# Patient Record
Sex: Male | Born: 2000 | Race: White | Hispanic: No | Marital: Single | Smoking: Never smoker
Health system: Southern US, Community
[De-identification: ages and names within clinical notes are randomized; demographics above are authoritative.]

## PROBLEM LIST (undated history)

## (undated) DIAGNOSIS — K5190 Ulcerative colitis, unspecified, without complications: Secondary | ICD-10-CM

## (undated) DIAGNOSIS — F32A Depression, unspecified: Secondary | ICD-10-CM

## (undated) HISTORY — DX: Depression, unspecified: F32.A

## (undated) HISTORY — DX: Ulcerative colitis, unspecified, without complications: K51.90

## (undated) HISTORY — PX: NO PAST SURGERIES: SHX2092

---
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
## 2022-01-04 ENCOUNTER — Other Ambulatory Visit (HOSPITAL_BASED_OUTPATIENT_CLINIC_OR_DEPARTMENT_OTHER): Payer: Self-pay

## 2022-01-06 ENCOUNTER — Ambulatory Visit (HOSPITAL_BASED_OUTPATIENT_CLINIC_OR_DEPARTMENT_OTHER): Payer: BC Managed Care – PPO | Attending: Orthopaedic Surgery | Admitting: Physical Therapy

## 2022-01-06 ENCOUNTER — Encounter (HOSPITAL_BASED_OUTPATIENT_CLINIC_OR_DEPARTMENT_OTHER): Payer: Self-pay | Admitting: Physical Therapy

## 2022-01-06 ENCOUNTER — Encounter (HOSPITAL_BASED_OUTPATIENT_CLINIC_OR_DEPARTMENT_OTHER): Payer: Self-pay

## 2022-01-06 DIAGNOSIS — M25511 Pain in right shoulder: Secondary | ICD-10-CM | POA: Diagnosis not present

## 2022-01-06 DIAGNOSIS — G8929 Other chronic pain: Secondary | ICD-10-CM | POA: Diagnosis present

## 2022-01-06 DIAGNOSIS — M25512 Pain in left shoulder: Secondary | ICD-10-CM | POA: Insufficient documentation

## 2022-01-06 NOTE — Therapy (Signed)
?OUTPATIENT PHYSICAL THERAPY SHOULDER Treatment/Discharge  ? ? ?Patient Name: Paul [name] ?MRN: [id number] ?DOB:12/27/2000, 21 y.o., male ?Today's Date: 01/06/2022 ? ? PT End of Session - 01/06/22 1956   ? ? Visit Number 6   ? Number of Visits 12   ? Date for PT Re-Evaluation 01/06/22   ? PT Start Time 0845   ? PT Stop Time 0370   ? PT Time Calculation (min) 42 min   ? Activity Tolerance Patient tolerated treatment well   ? Behavior During Therapy [facility] for tasks assessed/performed   ? ?  ?  ? ?  ? ? ? ? ?History reviewed. No pertinent past medical history. ?History reviewed. No pertinent surgical history. ?There are no problems to display for this patient. ? ? ?PCP: Elnita Maxwell, MD ? ?REFERRING PROVIDER: Donivan Scull MD  ? ?REFERRING DIAG: M25.511,M25.512 (ICD-10-CM) - Acute pain of both shoulders ? ?THERAPY DIAG:  ?Chronic right shoulder pain ? ?Chronic left shoulder pain ? ? ?ONSET DATE: > 1.5 years  ? ?SUBJECTIVE:                                                                                                                                                                                     ? ?SUBJECTIVE STATEMENT: ?The patient made it through his lacrosse season. No dislocations during his game, but he had a dislocation last night playing pick up of his left shoulder .His left shoulder is a little sore.  ? ?PERTINENT HISTORY: ?Nothing  ? ?PAIN:  ?Are you having pain? Yes: NPRS scale: 0/10 only with throwing  ?Pain location: right shoulder  ?Pain description: aching  ?Aggravating factors: use of the shoulder  ?Relieving factors: rest  ? ?PAIN:  ?Are you having pain? No ?VAS scale: 0/10 ?Pain location: right anterior shoulder  ?Pain orientation: Right  ?PAIN TYPE: aching ?Pain description: intermittent  ?Aggravating factors: end range er  ?Relieving factors: not putting his shoulder in that position.   ? ?PRECAUTIONS: None ? ?WEIGHT BEARING RESTRICTIONS No ? ?FALLS:  ?Has patient fallen in last 6 months? No  Number of falls: other then lacrosse  ? ?LIVING ENVIRONMENT: ? ? 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 ?PLOF: Independent ? ?PATIENT GOALS  ? ?To  continue to play through his senior season  ? ?OBJECTIVE:  ?Today's treatment  ?5/10 ?Side lying ER 4 lbs x20 each arm ?Supine ABC 5 lbs   ?  ?Quadruped rock 2x10  ?Alt LE x15 ? ?Row 30 lbs x20  ?Shoulder extension 25 x20  ? ?Cable chop 10lbs 2x15  ?Pallof press 2x15  ? ?High plank reach x10 each  ?High plank alt LE  ? ?Plank  bird dog x10 each  ? ?  ? ? ?4/25 ?Bench press 3lb bar for training- 45 lb press upstairs ?Standing & seated lat pull down ?Standing row ?Trx planks ?Standing ER at 75 abd- parallel to ER & top of thighs to ER ?3lb bar with black tband resist- recreating defensive move on field ?Ktape applied for cues to rectus & obliques ? ? ?4/21 ?Side lying ER 4 lbs x20 each arm ?Supine ABC 5 lbs   ?  ?Quadruped rock 2x10  ?Alt LE x15 ? ?Row 30 lbs x20  ?Shoulder extension 25 x20  ? ?Cable chop 10lbs 2x15  ?Pallof press 2x15  ? ?4/4 ?Side lying ER 4 lbs x20 each arm ?Supine ABC 5 lbs   ? ?Standing IR and ER red band x20 each arm  ? ?UBE 2 in fwd and back  ? ?High plank hold 30 sec  ?Hight plank alt UE x10 each  ?High plank alt LE x10  ? ?Row 30 lbs 2x15  ? ?Shoulder extension 2x15 30 lb  ? ? ? ? ?PATIENT EDUCATION: ?Education details: updated HEP  ?Person educated: Patient ?Education method: Explanation, Demonstration, Tactile cues, Verbal cues, and Handouts ?Education comprehension: verbalized understanding, returned demonstration, verbal cues required, tactile cues required, and needs further education ? ? ?HOME EXERCISE PROGRAM: ?Access Code: [email or web address] ?URL: [email or web address] ? ? ? ?ASSESSMENT: ? ?CLINICAL IMPRESSION: ?Patient has reached max potential for physical therapy at this time he has a full HEP to work. On. He continues to have dislocations at this time. He is done with his lacrosse season. He hopes to continue with weight lifting  We reviewed weight lifting activity to avoid such as exercises where he cant see the back of his hands. He will talk with his mom and the MD and figure out what the best course of action is. In the meantime he will continue with his exercises. He was given a high plank series today..  ? ?OBJECTIVE IMPAIRMENTS  ?decreased activity tolerance, decreased ROM, decreased strength, and pain ?ACTIVITY LIMITATIONS community activity and Lacrosse  .  ? ?No PERSONAL FACTORS  are also affecting patient's functional outcome.  ? ? ?REHAB POTENTIAL: Excellent ? ?CLINICAL DECISION MAKING: Stable/uncomplicated ? ?EVALUATION COMPLEXITY: Low ? ? ?GOALS: ?Goals reviewed with patient? No ? ?SHORT TERM GOALS: Target date: 01/27/2022 ? ?Patient will demonstrate full end range motion bilateral with all movements without pan  ?Baseline:  ?Goal status:no pain archived  ? ?2.  Patient will be independent with a mid range strengthening program  ?Baseline:  ?Goal status: independent  ? ?3.  Patient will report no dislocations of his shoulder over a 3 week period ?Baseline:  ?Goal status: played the whole season with 1 dislocation  ? ? ?LONG TERM GOALS: Target date: 02/17/2022 ? ?Patient will avoid surgical intervention and finish his lacrosse season   ?Baseline:  ?Goal status: achieved  ? ?2.  Patient will have a full strength and stabilization program.  ?Baseline:  ?Goal status: has full program  ? ?PLAN: ?PT FREQUENCY: 1-2x/week ? ?PT DURATION: 6 weeks ? ?PLANNED INTERVENTIONS: Therapeutic exercises, Therapeutic activity, Neuromuscular re-education, Balance training, Gait training, Patient/Family education, Joint mobilization, Electrical stimulation, Cryotherapy, Moist heat, Taping, and Manual therapy ? ?PLAN FOR NEXT SESSION: continue with mid range stabilization activity, consider high plank activity, progress ER/IR activity and amount of abduction; continue with closed chain exercises; continue to review closed chained exercises.  ?Ktape  helpful cue? ? ?PHYSICAL THERAPY DISCHARGE SUMMARY ? ?Visits from Start of Care:  6 ? ?Current functional level related to goals / functional outcomes: ?No pain  ?  ?Remaining deficits: ?Still dislocating  ?  ?Education / Equipment: ?Reviewed HEP and symptom management  ? ?Patient agrees to discharge. Patient goals were met. Patient is being discharged due to being pleased with the current functional level.  ? ?Carney Living, PT DPT  ?01/06/2022, 7:59 PM  ?

## 2022-01-07 ENCOUNTER — Encounter (HOSPITAL_BASED_OUTPATIENT_CLINIC_OR_DEPARTMENT_OTHER): Payer: Self-pay | Admitting: Physical Therapy

## 2022-01-07 ENCOUNTER — Other Ambulatory Visit (HOSPITAL_BASED_OUTPATIENT_CLINIC_OR_DEPARTMENT_OTHER): Payer: Self-pay

## 2022-01-07 MED ORDER — AMPHETAMINE-DEXTROAMPHET ER 15 MG PO CP24
ORAL_CAPSULE | ORAL | 0 refills | Status: DC
  Filled 2022-01-07: qty 30, 30d supply, fill #0

## 2022-02-02 ENCOUNTER — Other Ambulatory Visit (HOSPITAL_BASED_OUTPATIENT_CLINIC_OR_DEPARTMENT_OTHER): Payer: Self-pay

## 2022-02-12 ENCOUNTER — Other Ambulatory Visit (HOSPITAL_BASED_OUTPATIENT_CLINIC_OR_DEPARTMENT_OTHER): Payer: Self-pay

## 2022-02-12 MED ORDER — AMPHETAMINE-DEXTROAMPHET ER 15 MG PO CP24
ORAL_CAPSULE | ORAL | 0 refills | Status: DC
  Filled 2022-02-12: qty 30, 30d supply, fill #0

## 2022-03-09 ENCOUNTER — Other Ambulatory Visit (HOSPITAL_BASED_OUTPATIENT_CLINIC_OR_DEPARTMENT_OTHER): Payer: Self-pay

## 2022-03-09 MED ORDER — AMPHETAMINE-DEXTROAMPHET ER 15 MG PO CP24
ORAL_CAPSULE | ORAL | 0 refills | Status: DC
  Filled 2022-03-12: qty 30, 30d supply, fill #0

## 2022-03-10 ENCOUNTER — Other Ambulatory Visit (HOSPITAL_BASED_OUTPATIENT_CLINIC_OR_DEPARTMENT_OTHER): Payer: Self-pay

## 2022-03-10 MED ORDER — AMPICILLIN 500 MG PO CAPS
500.0000 mg | ORAL_CAPSULE | Freq: Two times a day (BID) | ORAL | 2 refills | Status: DC
  Filled 2022-03-10: qty 60, 30d supply, fill #0
  Filled 2022-04-05: qty 60, 30d supply, fill #1
  Filled 2022-05-27: qty 60, 30d supply, fill #2

## 2022-03-11 ENCOUNTER — Other Ambulatory Visit (HOSPITAL_BASED_OUTPATIENT_CLINIC_OR_DEPARTMENT_OTHER): Payer: Self-pay

## 2022-03-12 ENCOUNTER — Other Ambulatory Visit (HOSPITAL_BASED_OUTPATIENT_CLINIC_OR_DEPARTMENT_OTHER): Payer: Self-pay

## 2022-04-05 ENCOUNTER — Other Ambulatory Visit (HOSPITAL_BASED_OUTPATIENT_CLINIC_OR_DEPARTMENT_OTHER): Payer: Self-pay

## 2022-04-05 DIAGNOSIS — L245 Irritant contact dermatitis due to other chemical products: Secondary | ICD-10-CM | POA: Diagnosis not present

## 2022-04-05 MED ORDER — HYDROCORTISONE 2.5 % EX CREA
TOPICAL_CREAM | CUTANEOUS | 2 refills | Status: DC
  Filled 2022-04-05: qty 30, 7d supply, fill #0

## 2022-04-07 DIAGNOSIS — Z0000 Encounter for general adult medical examination without abnormal findings: Secondary | ICD-10-CM | POA: Diagnosis not present

## 2022-04-07 DIAGNOSIS — F900 Attention-deficit hyperactivity disorder, predominantly inattentive type: Secondary | ICD-10-CM | POA: Diagnosis not present

## 2022-04-12 ENCOUNTER — Other Ambulatory Visit (HOSPITAL_BASED_OUTPATIENT_CLINIC_OR_DEPARTMENT_OTHER): Payer: Self-pay

## 2022-04-12 MED ORDER — AMPHETAMINE-DEXTROAMPHET ER 15 MG PO CP24
ORAL_CAPSULE | ORAL | 0 refills | Status: DC
  Filled 2022-04-12: qty 30, 30d supply, fill #0

## 2022-04-20 ENCOUNTER — Encounter: Payer: Self-pay | Admitting: Nurse Practitioner

## 2022-04-20 ENCOUNTER — Telehealth: Payer: Self-pay

## 2022-04-20 DIAGNOSIS — K921 Melena: Secondary | ICD-10-CM | POA: Diagnosis not present

## 2022-04-20 DIAGNOSIS — R195 Other fecal abnormalities: Secondary | ICD-10-CM

## 2022-04-20 NOTE — Telephone Encounter (Addendum)
-----   Message from Benancio Deeds, MD sent at 04/20/2022 12:45 PM EDT ----- Regarding: RE: urgent add-on Thursday clinic Yes that is okay with me. Jill Side if you see this patient you can staff with me.  Thanks ----- Message ----- From: Missy Sabins, RN Sent: 04/20/2022   9:39 AM EDT To: Benancio Deeds, MD Subject: RE: urgent add-on Thursday clinic              Dr. Adela Lank, I can get him scheduled with [facility] on Thursday, 04/22/22 at 1:30 pm. That will work better for the staff since you will be overbooked for the morning, if you are OK with this. Let me know. Thanks ----- Message ----- From: Benancio Deeds, MD Sent: 04/20/2022   9:31 AM EDT To: Missy Sabins, RN Subject: urgent add-on Thursday clinic                  [facility],  This patient needs to be added to my clinic on Thursday at 1130 AM if you can help get a spot for him? Can you call mother Toniann Fail - [phone] to confirm they can make it that day? Can you also order a GI pathogen panel to the lab and ask her to have her son come by the lab to get the container and submit when he can? I would also like to do a CBC, CRP, and CMET to make sure okay if you can order that. Thanks very much. Can you confirm with me she is okay to schedule for this time?   Thanks Dr. Mervyn Skeeters

## 2022-04-20 NOTE — Telephone Encounter (Signed)
Called and spoke with patient's mother, Toniann Fail. She is aware that we have rescheduled patient's appt to Thursday, 04/22/22 at 1:30 pm with Alcide Evener, NP. Toniann Fail is aware that Dr. Adela Lank would like for patient to complete stool study and lab work. Toniann Fail states that she is going to pick patient up from college tomorrow after work. They will stop by at their convenience to complete labs and stool study. Toniann Fail verbalized understanding and had no concerns at the end of the call.

## 2022-04-22 ENCOUNTER — Other Ambulatory Visit (INDEPENDENT_AMBULATORY_CARE_PROVIDER_SITE_OTHER): Payer: BC Managed Care – PPO

## 2022-04-22 ENCOUNTER — Encounter: Payer: Self-pay | Admitting: Nurse Practitioner

## 2022-04-22 ENCOUNTER — Ambulatory Visit: Payer: BC Managed Care – PPO | Admitting: Nurse Practitioner

## 2022-04-22 ENCOUNTER — Other Ambulatory Visit (HOSPITAL_BASED_OUTPATIENT_CLINIC_OR_DEPARTMENT_OTHER): Payer: Self-pay

## 2022-04-22 VITALS — BP 100/60 | HR 68 | Ht 71.5 in | Wt 187.0 lb

## 2022-04-22 DIAGNOSIS — R195 Other fecal abnormalities: Secondary | ICD-10-CM

## 2022-04-22 DIAGNOSIS — R152 Fecal urgency: Secondary | ICD-10-CM

## 2022-04-22 DIAGNOSIS — R301 Vesical tenesmus: Secondary | ICD-10-CM

## 2022-04-22 DIAGNOSIS — K921 Melena: Secondary | ICD-10-CM | POA: Diagnosis not present

## 2022-04-22 DIAGNOSIS — R197 Diarrhea, unspecified: Secondary | ICD-10-CM | POA: Diagnosis not present

## 2022-04-22 DIAGNOSIS — K5900 Constipation, unspecified: Secondary | ICD-10-CM | POA: Diagnosis not present

## 2022-04-22 DIAGNOSIS — K5180 Other ulcerative colitis without complications: Secondary | ICD-10-CM | POA: Diagnosis not present

## 2022-04-22 DIAGNOSIS — K625 Hemorrhage of anus and rectum: Secondary | ICD-10-CM | POA: Diagnosis not present

## 2022-04-22 DIAGNOSIS — R140 Abdominal distension (gaseous): Secondary | ICD-10-CM

## 2022-04-22 DIAGNOSIS — R17 Unspecified jaundice: Secondary | ICD-10-CM | POA: Insufficient documentation

## 2022-04-22 LAB — CBC WITH DIFFERENTIAL/PLATELET
Basophils Absolute: 0 10*3/uL (ref 0.0–0.1)
Basophils Relative: 0.3 % (ref 0.0–3.0)
Eosinophils Absolute: 0.5 10*3/uL (ref 0.0–0.7)
Eosinophils Relative: 5.6 % — ABNORMAL HIGH (ref 0.0–5.0)
HCT: 43.7 % (ref 36.0–49.0)
Hemoglobin: 15.1 g/dL (ref 12.0–16.0)
Lymphocytes Relative: 28 % (ref 24.0–48.0)
Lymphs Abs: 2.4 10*3/uL (ref 0.7–4.0)
MCHC: 34.5 g/dL (ref 31.0–37.0)
MCV: 84.3 fl (ref 78.0–98.0)
Monocytes Absolute: 0.8 10*3/uL (ref 0.1–1.0)
Monocytes Relative: 9.2 % (ref 3.0–12.0)
Neutro Abs: 4.9 10*3/uL (ref 1.4–7.7)
Neutrophils Relative %: 56.9 % (ref 43.0–71.0)
Platelets: 239 10*3/uL (ref 150.0–575.0)
RBC: 5.18 Mil/uL (ref 3.80–5.70)
RDW: 12.7 % (ref 11.4–15.5)
WBC: 8.7 10*3/uL (ref 4.5–13.5)

## 2022-04-22 LAB — BILIRUBIN, DIRECT: Bilirubin, Direct: 0.3 mg/dL (ref 0.0–0.3)

## 2022-04-22 LAB — COMPREHENSIVE METABOLIC PANEL
ALT: 16 U/L (ref 0–53)
AST: 17 U/L (ref 0–37)
Albumin: 4.7 g/dL (ref 3.5–5.2)
Alkaline Phosphatase: 90 U/L (ref 52–171)
BUN: 13 mg/dL (ref 6–23)
CO2: 27 mEq/L (ref 19–32)
Calcium: 9.9 mg/dL (ref 8.4–10.5)
Chloride: 106 mEq/L (ref 96–112)
Creatinine, Ser: 1.17 mg/dL (ref 0.40–1.50)
GFR: 90.63 mL/min (ref >60.00)
Glucose, Bld: 86 mg/dL (ref 70–99)
Potassium: 3.8 mEq/L (ref 3.5–5.1)
Sodium: 140 mEq/L (ref 135–145)
Total Bilirubin: 1.6 mg/dL — ABNORMAL HIGH (ref 0.2–1.2)
Total Protein: 7.2 g/dL (ref 6.0–8.3)

## 2022-04-22 LAB — C-REACTIVE PROTEIN: CRP: <1 mg/dL (ref 0.5–20.0)

## 2022-04-22 MED ORDER — NA SULFATE-K SULFATE-MG SULF 17.5-3.13-1.6 GM/177ML PO SOLN
1.0000 | Freq: Once | ORAL | 0 refills | Status: AC
  Filled 2022-04-22: qty 354, 1d supply, fill #0

## 2022-04-22 NOTE — Progress Notes (Signed)
04/22/2022 Clydene Pugh [id number] Dec 06, 2000   CHIEF COMPLAINT: Bloody stools   HISTORY OF PRESENT ILLNESS:  Paul [name] is a 21 year old male with a past medical history of depression. No past surgical history. He presents today as requested by Dr. Benay Spice for further evaluation regarding bloody bowel movements. He is accompanied by his mother who is a Therapist, sports, she works at Dr. [employer] office. Paul [name] endorsed having constipation 3 weeks ago, stools were solid and hard but easy to pass. He initially passed a small to moderate amount of darker red blood with brighter red blood in the mucous per the rectum. No associated anorectal pain at that time. However, he recalled working out in the gym, doing squats, which resulted in hemorrhoidal swelling, pain and minor bleeding. It hurt to sit down. He used Preparation H and his symptoms improved. Towards the end of July into early August 2023, he became constipated and bloated. He took Miralax for 2 days and his constipation somewhat improved. His bowel pattern has varied with increased urgency and tenesmus symptoms. He had diarrhea with urgency x 1 day three days ago. Today, he passed a pencil thin stool with bloody mucous and gas. No abdominal pain. No current anorectal pain.  He takes Ampicillin 500 mg twice daily for acne for several months.  He takes Advil 215m three tabs once every 3 weeks for headaches. He is a fMuseum/gallery exhibitions officerat [employer] he went to student health on 04/20/2022 and labs were done which showed a WBC count of 6.8.  Hemoglobin 15.8.  Hematocrit 47.3.  Platelet 225.  Iron 81.  Iron saturation 25%.  TIBC 338.  Total bili 1.4.  Alk phos 112.  AST 21.  ALT 18.  BUN 13.  Creatinine 1.12.  Labs today showed a WBC count of 8.7.  Hemoglobin 15.1.  Hematocrit 43.7.  Platelet 239.  Total bili 1.6 with normal alk phos, AST/ALT levels.  CRP < 1.0.  A GI pathogen panel was collected, results pending.    Maternal great  grandmother with history of colon cancer.  Mother and maternal grandfather with history of colon polyps.  Maternal cousin with history of ulcerative colitis.      Latest Ref Rng & Units 04/22/2022    7:42 AM  CBC  WBC 4.5 - 13.5 K/uL 8.7   Hemoglobin 12.0 - 16.0 g/dL 15.1   Hematocrit 36.0 - 49.0 % 43.7   Platelets 150.0 - 575.0 K/uL 239.0        Latest Ref Rng & Units 04/22/2022    7:42 AM  CMP  Glucose 70 - 99 mg/dL 86   BUN 6 - 23 mg/dL 13   Creatinine 0.40 - 1.50 mg/dL 1.17   Sodium 135 - 145 mEq/L 140   Potassium 3.5 - 5.1 mEq/L 3.8   Chloride 96 - 112 mEq/L 106   CO2 19 - 32 mEq/L 27   Calcium 8.4 - 10.5 mg/dL 9.9   Total Protein 6.0 - 8.3 g/dL 7.2   Total Bilirubin 0.2 - 1.2 mg/dL 1.6   Alkaline Phos 52 - 171 U/L 90   AST 0 - 37 U/L 17   ALT 0 - 53 U/L 16     CRP < 1   Past Medical History:  Diagnosis Date   Depression    Past Surgical History:  Procedure Laterality Date   NO PAST SURGERIES     Social History: He is a fEnvironmental managerat [employer]  Non-smoker.  No alcohol use.  No drug use.  Family History: family history includes Bladder Cancer in his paternal grandfather; Colon cancer in his maternal great-grandmother; Colon polyps in his maternal grandfather and mother; Diabetes in his maternal grandmother, maternal great-grandmother, and maternal uncle; Heart disease in his paternal grandmother; Hyperlipidemia in his father and paternal grandmother; Hypertension in his father and maternal grandmother; Irritable bowel syndrome in his mother; Prostate cancer in his paternal grandfather; Ulcerative colitis in his cousin. [place]   No Known Allergies    Outpatient Encounter Medications as of 04/22/2022  Medication Sig   amphetamine-dextroamphetamine (ADDERALL XR) 15 MG 24 hr capsule Take 1 capsules by mouth every morning   ampicillin (PRINCIPEN) 500 MG capsule Take 1 capsule by mouth twice a day   ascorbic acid (VITAMIN C) 500  MG tablet Take 1 tablet by mouth daily.   clindamycin (CLINDAGEL) 1 % gel Apply 1 Application topically daily.   fluticasone (FLONASE ALLERGY RELIEF) 50 MCG/ACT nasal spray Place 1 Squirt into the nose daily.   hydrocortisone 2.5 % cream Apply 1 gram to skin twice a day to affected area twice a day for 7-10 days as needed   loratadine (CLARITIN) 10 MG tablet Take 1 tablet by mouth as needed.   tretinoin (RETIN-A) 0.025 % cream Apply a pea-sized amount to face nightly   [DISCONTINUED] ADDERALL XR 15 MG 24 hr capsule TAKE 1 CAPSULE BY MOUTH EVERY MORNING   [DISCONTINUED] amphetamine-dextroamphetamine (ADDERALL XR) 15 MG 24 hr capsule TAKE 1 CAPSULE BY MOUTH EVERY MORNING   [DISCONTINUED] amphetamine-dextroamphetamine (ADDERALL XR) 15 MG 24 hr capsule TAKE 1 CAPSULE BY MOUTH EVERY MORNING   [DISCONTINUED] amphetamine-dextroamphetamine (ADDERALL XR) 15 MG 24 hr capsule TAKE 1 CAPSULE BY MOUTH EVERY MORNING   [DISCONTINUED] amphetamine-dextroamphetamine (ADDERALL XR) 15 MG 24 hr capsule TAKE 1 CAPSULE BY MOUTH EVERY MORNING   [DISCONTINUED] amphetamine-dextroamphetamine (ADDERALL XR) 15 MG 24 hr capsule TAKE 1 CAPSULE BY MOUTH EVERY MORNING   [DISCONTINUED] amphetamine-dextroamphetamine (ADDERALL XR) 15 MG 24 hr capsule TAKE 1 CAPSULE BY MOUTH EVERY MORNING   [DISCONTINUED] amphetamine-dextroamphetamine (ADDERALL XR) 15 MG 24 hr capsule Take 1 capsule by mouth every morning   [DISCONTINUED] amphetamine-dextroamphetamine (ADDERALL XR) 15 MG 24 hr capsule TAKE 1 CAPSULE BY MOUTH EVERY MORNING   [DISCONTINUED] amphetamine-dextroamphetamine (ADDERALL XR) 15 MG 24 hr capsule Take 1 capsule by mouth in the morning   [DISCONTINUED] amphetamine-dextroamphetamine (ADDERALL XR) 15 MG 24 hr capsule Take 1 capsule by mouth in the morning   [DISCONTINUED] amphetamine-dextroamphetamine (ADDERALL XR) 15 MG 24 hr capsule Take 1 capsule by mouth in the morning   [DISCONTINUED] amphetamine-dextroamphetamine (ADDERALL XR)  15 MG 24 hr capsule 1 cap(s) Oral qam,x30 day(s)   [DISCONTINUED] doxycycline (VIBRAMYCIN) 100 MG capsule Take 1 capsule by mouth twice a day with food.  Avoid dairy products 2 hours before & after dose.   [DISCONTINUED] influenza vac split quadrivalent PF (FLUARIX) 0.5 ML injection Inject into the muscle.   [DISCONTINUED] oseltamivir (TAMIFLU) 75 MG capsule 1 cap(s) Oral bid,x5 day(s)   No facility-administered encounter medications on file as of 04/22/2022.    REVIEW OF SYSTEMS:  Gen: Denies fever, sweats or chills. No weight loss.  CV: Denies chest pain, palpitations or edema. Resp: Denies cough, shortness of breath of hemoptysis.  GI: See HPI.  No GERD symptoms.   GU : Denies urinary burning, blood in urine, increased urinary frequency or incontinence. MS: Denies joint pain, muscles aches or weakness. Derm: Denies rash, itchiness,  skin lesions or unhealing ulcers. Psych: Denies depression, anxiety or memory loss. Heme: Denies bruising, easy bleeding. Neuro:  Denies headaches, dizziness or paresthesias. Endo:  Denies any problems with DM, thyroid or adrenal function.  PHYSICAL EXAM: BP 100/60 (BP Location: Left Arm, Patient Position: Sitting, Cuff Size: Normal)   Pulse 68   Ht 5' 11.5" (1.816 m) Comment: height measured without shoes  Wt 187 lb (84.8 kg)   BMI 25.72 kg/m  General: 21 year old male in no acute distress. Head: Normocephalic and atraumatic. Eyes:  Sclerae non-icteric, conjunctive pink. Ears: Normal auditory acuity. Mouth: Dentition intact. No ulcers or lesions.  Neck: Supple, no lymphadenopathy or thyromegaly.  Lungs: Clear bilaterally to auscultation without wheezes, crackles or rhonchi. Heart: Regular rate and rhythm. No murmur, rub or gallop appreciated.  Abdomen: Soft, nontender, non distended. No masses. No hepatosplenomegaly. Normoactive bowel sounds x 4 quadrants.  Rectal: No external hemorrhoids.  Mildly friable coasted to the posterior anal area without a  discrete fissure.  Internal hemorrhoids palpated without prolapse.  No blood, mucus or purulent discharge.  No stool or mass in the rectal vault.  Patty RN present during exam. Musculoskeletal: Symmetrical with no gross deformities. Skin: Warm and dry. No rash or lesions on visible extremities. Extremities: No edema. Neurological: Alert oriented x 4, no focal deficits.  Psychological:  Alert and cooperative. Normal mood and affect.  ASSESSMENT AND PLAN:  39) 21 year old male with rectal bleeding, bloody mucous per the rectum with tenesmus and urgent bowel movements (mostly constipation, 1 episode of diarrhea) concerning for possible colitis/proctitis.  Normal CBC and CRP.  -GI pathogen panel pending -Diagnostic colonoscopy benefits and risks discussed including risk with sedation, risk of bleeding, perforation and infection  -Apply a small amount of Desitin inside the anal opening and to the external anal area tid as needed for anal or hemorrhoidal irritation/bleeding.  Patient instructed not to use Desitin the night before or morning of his colonoscopy date. 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 -Add direct bilirubin level to today's earlier lab draw        CC:  Elnita Maxwell, MD

## 2022-04-22 NOTE — Patient Instructions (Addendum)
1) Push fluids   2) Take Miralax 1 capful mixed in 8 ounces of water at bed time for constipation as tolerated  3) Apply a small amount of Desitin inside the anal opening and to the external anal area tid as needed for anal or hemorrhoidal irritation/bleeding. DO NOT USE DESITIN NIGHT OR MORNING BEFORE YOUR COLONOSCOPY   4) We have scheduled you for a colonoscopy and provided you the instructions.

## 2022-04-22 NOTE — Progress Notes (Signed)
Agree with assessment and plan as outlined.  

## 2022-04-23 ENCOUNTER — Encounter: Payer: Self-pay | Admitting: Gastroenterology

## 2022-04-25 LAB — GI PROFILE, STOOL, PCR

## 2022-04-26 ENCOUNTER — Other Ambulatory Visit (HOSPITAL_BASED_OUTPATIENT_CLINIC_OR_DEPARTMENT_OTHER): Payer: Self-pay

## 2022-04-26 ENCOUNTER — Encounter: Payer: Self-pay | Admitting: Gastroenterology

## 2022-04-26 ENCOUNTER — Ambulatory Visit (AMBULATORY_SURGERY_CENTER): Payer: BC Managed Care – PPO | Admitting: Gastroenterology

## 2022-04-26 VITALS — BP 91/69 | HR 63 | Temp 98.6°F | Resp 13 | Ht 71.0 in | Wt 187.0 lb

## 2022-04-26 DIAGNOSIS — K6289 Other specified diseases of anus and rectum: Secondary | ICD-10-CM | POA: Diagnosis not present

## 2022-04-26 DIAGNOSIS — K921 Melena: Secondary | ICD-10-CM | POA: Diagnosis not present

## 2022-04-26 DIAGNOSIS — K51011 Ulcerative (chronic) pancolitis with rectal bleeding: Secondary | ICD-10-CM

## 2022-04-26 DIAGNOSIS — K51211 Ulcerative (chronic) proctitis with rectal bleeding: Secondary | ICD-10-CM | POA: Diagnosis not present

## 2022-04-26 MED ORDER — MESALAMINE 1.2 G PO TBEC
DELAYED_RELEASE_TABLET | ORAL | 3 refills | Status: DC
  Filled 2022-04-26: qty 120, 60d supply, fill #0
  Filled 2022-05-27 – 2022-06-10 (×2): qty 120, 60d supply, fill #1

## 2022-04-26 MED ORDER — MESALAMINE 1000 MG RE SUPP
1000.0000 mg | Freq: Every day | RECTAL | 1 refills | Status: DC
  Filled 2022-04-26: qty 30, 30d supply, fill #0

## 2022-04-26 MED ORDER — SODIUM CHLORIDE 0.9 % IV SOLN: 500.0000 mL | Freq: Once | INTRAVENOUS | Status: DC

## 2022-04-26 NOTE — Patient Instructions (Signed)
Discharge instructions given. Prescriptions sent to pharmacy. Biopsies taken. Resume previous medications. YOU HAD AN ENDOSCOPIC PROCEDURE TODAY AT THE [facility]:   Refer to the procedure report that was given to you for any specific questions about what was found during the examination.  If the procedure report does not answer your questions, please call your gastroenterologist to clarify.  If you requested that your care partner not be given the details of your procedure findings, then the procedure report has been included in a sealed envelope for you to review at your convenience later.  YOU SHOULD EXPECT: Some feelings of bloating in the abdomen. Passage of more gas than usual.  Walking can help get rid of the air that was put into your GI tract during the procedure and reduce the bloating. If you had a lower endoscopy (such as a colonoscopy or flexible sigmoidoscopy) you may notice spotting of blood in your stool or on the toilet paper. If you underwent a bowel prep for your procedure, you may not have a normal bowel movement for a few days.  Please Note:  You might notice some irritation and congestion in your nose or some drainage.  This is from the oxygen used during your procedure.  There is no need for concern and it should clear up in a day or so.  SYMPTOMS TO REPORT IMMEDIATELY:  Following lower endoscopy (colonoscopy or flexible sigmoidoscopy):  Excessive amounts of blood in the stool  Significant tenderness or worsening of abdominal pains  Swelling of the abdomen that is new, acute  Fever of 100F or higher   For urgent or emergent issues, a gastroenterologist can be reached at any hour by calling [phone]. Do not use MyChart messaging for urgent concerns.    DIET:  We do recommend a small meal at first, but then you may proceed to your regular diet.  Drink plenty of fluids but you should avoid alcoholic beverages for 24 hours.  ACTIVITY:  You should plan  to take it easy for the rest of today and you should NOT DRIVE or use heavy machinery until tomorrow (because of the sedation medicines used during the test).    FOLLOW UP: Our staff will call the number listed on your records the next business day following your procedure.  We will call around 7:15- 8:00 am to check on you and address any questions or concerns that you may have regarding the information given to you following your procedure. If we do not reach you, we will leave a message.  If you develop any symptoms (ie: fever, flu-like symptoms, shortness of breath, cough etc.) before then, please call [phone].  If you test positive for Covid 19 in the 2 weeks post procedure, please call and report this information to [place].    If any biopsies were taken you will be contacted by phone or by letter within the next 1-3 weeks.  Please call us at [phone] if you have not heard about the biopsies in 3 weeks.    SIGNATURES/CONFIDENTIALITY: You and/or your care partner have signed paperwork which will be entered into your electronic medical record.  These signatures attest to the fact that that the information above on your After Visit Summary has been reviewed and is understood.  Full responsibility of the confidentiality of this discharge information lies with you and/or your care-partner.

## 2022-04-26 NOTE — Progress Notes (Signed)
Report to pacu rn. Vss. Care resumed by rn. 

## 2022-04-26 NOTE — Progress Notes (Signed)
Called to room to assist during endoscopic procedure.  Patient ID and intended procedure confirmed with present staff. Received instructions for my participation in the procedure from the performing physician.  

## 2022-04-26 NOTE — Progress Notes (Signed)
History and Physical Interval Note: Patient seen on 04/22/22 - no interval changes. Intermittent rectal bleeding with mucous and urgency. Labs look okay and GI pathogen panel negative. Colonoscopy done to evaluate for colitis. Have discussed risks / benefits and he agrees, wishes to proceed.  04/26/2022 2:45 PM  Paul [name]  has presented today for endoscopic procedure(s), with the diagnosis of  Encounter Diagnosis  Name Primary?   Blood in stool Yes  .  The various methods of evaluation and treatment have been discussed with the patient and/or family. After consideration of risks, benefits and other options for treatment, the patient has consented to  the endoscopic procedure(s).   The patient's history has been reviewed, patient examined, no change in status, stable for surgery.  I have reviewed the patient's chart and labs.  Questions were answered to the patient's satisfaction.    Harlin Rain, MD [facility]

## 2022-04-26 NOTE — Op Note (Addendum)
Paul [name] Patient Name: Paul [name] Procedure Date: 04/26/2022 2:39 PM MRN: [id number] Endoscopist: Viviann Spare P. Adela Lank , MD Age: 21 Referring MD:  Date of Birth: Aug 03, 2001 Gender: Male Account #: [id number] Procedure:                Colonoscopy Indications:              Rectal bleeding, Change in bowel habits (urgency,                            mucous). GI pathogen panel negative Medicines:                Monitored Anesthesia Care Procedure:                Pre-Anesthesia Assessment:                           - Prior to the procedure, a History and Physical                            was performed, and patient medications and                            allergies were reviewed. The patient's tolerance of                            previous anesthesia was also reviewed. The risks                            and benefits of the procedure and the sedation                            options and risks were discussed with the patient.                            All questions were answered, and informed consent                            was obtained. Prior Anticoagulants: The patient has                            taken no previous anticoagulant or antiplatelet                            agents. ASA Grade Assessment: II - A patient with                            mild systemic disease. After reviewing the risks                            and benefits, the patient was deemed in                            satisfactory condition to undergo the procedure.  After obtaining informed consent, the colonoscope                            was passed under direct vision. Throughout the                            procedure, the patient's blood pressure, pulse, and                            oxygen saturations were monitored continuously. The                            CF [id number] #[id number] was introduced through the anus                            and advanced to  the the terminal ileum, with                            identification of the appendiceal orifice and IC                            valve. The colonoscopy was performed without                            difficulty. The patient tolerated the procedure                            well. The quality of the bowel preparation was                            good. The terminal ileum, ileocecal valve,                            appendiceal orifice, and rectum were photographed. Scope In: 2:51:51 PM Scope Out: 3:08:59 PM Scope Withdrawal Time: 0 hours 13 minutes 51 seconds  Total Procedure Duration: 0 hours 17 minutes 8 seconds  Findings:                 The perianal and digital rectal examinations were                            normal.                           The terminal ileum appeared normal.                           Diffuse mild to moderate inflammation characterized                            by altered vascularity, erosions, erythema,                            friability and granularity was found in the rectum  up through the distal recto-sigmoid junction                            (roughly anal verge through distal 15cm of colon).                            Biopsies were taken with a cold forceps for                            histology.                           The exam was otherwise without abnormality.                            Retroflexed views of the rectum not obtained due to                            inflammation. Complications:            No immediate complications. Estimated blood loss:                            Minimal. Estimated Blood Loss:     Estimated blood loss was minimal. Impression:               - The examined portion of the ileum was normal.                           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 Biopsied.                           - The examination was otherwise normal.                           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 Will discuss with the patient                            and family Recommendation:           - Patient has a contact number available for                            emergencies. The signs and symptoms of potential                            delayed complications were discussed with the  patient. Return to normal activities tomorrow.                            Written discharge instructions were provided to the                            patient.                           - Resume previous diet.                           - Continue present medications.                           - Await pathology results.                           - Start Canasa suppository - one every night                           - Start Lialda - 2 tabs daily                           - Avoid all NSAIDs [name], MD 04/26/2022 3:18:13 PM This report has been signed electronically.

## 2022-04-27 ENCOUNTER — Other Ambulatory Visit (HOSPITAL_BASED_OUTPATIENT_CLINIC_OR_DEPARTMENT_OTHER): Payer: Self-pay

## 2022-04-27 ENCOUNTER — Telehealth: Payer: Self-pay | Admitting: *Deleted

## 2022-04-27 NOTE — Telephone Encounter (Signed)
Attempt for follow up phone call. No answer at number given.  Unable to leave a voicemail.

## 2022-04-28 ENCOUNTER — Telehealth: Payer: Self-pay | Admitting: Gastroenterology

## 2022-04-28 ENCOUNTER — Other Ambulatory Visit (HOSPITAL_BASED_OUTPATIENT_CLINIC_OR_DEPARTMENT_OTHER): Payer: Self-pay

## 2022-04-28 ENCOUNTER — Other Ambulatory Visit: Payer: Self-pay

## 2022-04-28 DIAGNOSIS — K921 Melena: Secondary | ICD-10-CM

## 2022-04-28 MED ORDER — MESALAMINE 1000 MG RE SUPP: 1000.0000 mg | Freq: Every day | RECTAL | 1 refills | Status: DC

## 2022-04-28 NOTE — Telephone Encounter (Signed)
Mesalamine 1000 mg Suppository was sent to patient's pharmacy [facility] on [address]

## 2022-04-28 NOTE — Telephone Encounter (Signed)
Patient mother called, states patient needs mesalamine 1000 mg suppository to be call in for [facility] to [name] [place] on [address] phone # [phone]. Please call to advise.

## 2022-05-19 ENCOUNTER — Encounter: Payer: Self-pay | Admitting: Gastroenterology

## 2022-05-19 DIAGNOSIS — K51011 Ulcerative (chronic) pancolitis with rectal bleeding: Secondary | ICD-10-CM

## 2022-05-19 NOTE — Telephone Encounter (Signed)
BMET and fecal calprotectin orders printed and faxed to [facility] at [phone].

## 2022-05-19 NOTE — Addendum Note (Signed)
Addended by: Yevette Edwards on: 05/19/2022 04:45 PM   Modules accepted: Orders

## 2022-05-20 ENCOUNTER — Ambulatory Visit: Payer: BC Managed Care – PPO | Admitting: Nurse Practitioner

## 2022-05-21 DIAGNOSIS — K51011 Ulcerative (chronic) pancolitis with rectal bleeding: Secondary | ICD-10-CM | POA: Diagnosis not present

## 2022-05-25 ENCOUNTER — Telehealth: Payer: Self-pay | Admitting: Gastroenterology

## 2022-05-25 NOTE — Telephone Encounter (Signed)
Patient or his mother reviewed MyChart message. Last read by Clydene Pugh at 10:12 AM on 05/25/2022.

## 2022-05-25 NOTE — Telephone Encounter (Signed)
MyChart message sent to patient with results

## 2022-05-25 NOTE — Telephone Encounter (Signed)
Labs reviewed for this patient:  Done 05/21/22: - BUN 13, Cr 1.03, normal electrolytes.  [place] can you please let the patient's mother know we got the BMET result which is NORMAL on mesalamine which is good news but still awaiting the fecal calprotectin. Will relay that once I receive it. Thanks

## 2022-05-27 ENCOUNTER — Encounter (HOSPITAL_BASED_OUTPATIENT_CLINIC_OR_DEPARTMENT_OTHER): Payer: Self-pay | Admitting: Pharmacist

## 2022-05-27 ENCOUNTER — Other Ambulatory Visit (HOSPITAL_BASED_OUTPATIENT_CLINIC_OR_DEPARTMENT_OTHER): Payer: Self-pay

## 2022-05-27 DIAGNOSIS — K51011 Ulcerative (chronic) pancolitis with rectal bleeding: Secondary | ICD-10-CM | POA: Diagnosis not present

## 2022-05-27 MED ORDER — AMPHETAMINE-DEXTROAMPHET ER 15 MG PO CP24
15.0000 mg | ORAL_CAPSULE | Freq: Every morning | ORAL | 0 refills | Status: DC
  Filled 2022-05-27: qty 30, 30d supply, fill #0

## 2022-06-03 ENCOUNTER — Telehealth: Payer: Self-pay | Admitting: Gastroenterology

## 2022-06-03 NOTE — Telephone Encounter (Signed)
Patient's mother reviewed and responded to MyChart message. Last read by Clydene Pugh at  3:40 PM on 06/03/2022.

## 2022-06-03 NOTE — Telephone Encounter (Signed)
MyChart message sent with results and recommendations.

## 2022-06-03 NOTE — Telephone Encounter (Signed)
Patient's labs arrived  Fecal calprotectin performed on September 28 -value of 91.  This is considered borderline elevated.  Paul [name] can you check on this patient and see how he is doing? He supposed to be on Canasa and Lialda 4 tabs daily.  I hope this is helping him.  If he still having symptoms please let me know, may need to consider a course of steroids.   Thanks

## 2022-06-07 ENCOUNTER — Other Ambulatory Visit: Payer: Self-pay

## 2022-06-07 ENCOUNTER — Encounter: Payer: Self-pay | Admitting: Gastroenterology

## 2022-06-07 DIAGNOSIS — K921 Melena: Secondary | ICD-10-CM

## 2022-06-07 DIAGNOSIS — K51011 Ulcerative (chronic) pancolitis with rectal bleeding: Secondary | ICD-10-CM

## 2022-06-07 MED ORDER — MESALAMINE 1.2 G PO TBEC: 4.8000 g | DELAYED_RELEASE_TABLET | Freq: Every day | ORAL | 2 refills | Status: DC

## 2022-06-07 MED ORDER — MESALAMINE 1000 MG RE SUPP: 1000.0000 mg | Freq: Every day | RECTAL | 2 refills | Status: DC

## 2022-06-10 ENCOUNTER — Other Ambulatory Visit (HOSPITAL_BASED_OUTPATIENT_CLINIC_OR_DEPARTMENT_OTHER): Payer: Self-pay

## 2022-07-01 ENCOUNTER — Other Ambulatory Visit (HOSPITAL_BASED_OUTPATIENT_CLINIC_OR_DEPARTMENT_OTHER): Payer: Self-pay

## 2022-07-02 ENCOUNTER — Other Ambulatory Visit (HOSPITAL_BASED_OUTPATIENT_CLINIC_OR_DEPARTMENT_OTHER): Payer: Self-pay

## 2022-07-02 MED ORDER — AMPICILLIN 500 MG PO CAPS
500.0000 mg | ORAL_CAPSULE | Freq: Two times a day (BID) | ORAL | 6 refills | Status: DC
  Filled 2022-07-02: qty 60, 30d supply, fill #0
  Filled 2023-01-17: qty 60, 30d supply, fill #1
  Filled 2023-03-17: qty 60, 30d supply, fill #2
  Filled 2023-05-29: qty 60, 30d supply, fill #3

## 2022-07-06 ENCOUNTER — Other Ambulatory Visit (HOSPITAL_BASED_OUTPATIENT_CLINIC_OR_DEPARTMENT_OTHER): Payer: Self-pay

## 2022-07-06 ENCOUNTER — Ambulatory Visit: Payer: BC Managed Care – PPO | Admitting: Gastroenterology

## 2022-07-06 ENCOUNTER — Encounter: Payer: Self-pay | Admitting: Gastroenterology

## 2022-07-06 VITALS — BP 100/70 | HR 62 | Ht 71.75 in | Wt 188.0 lb

## 2022-07-06 DIAGNOSIS — K5130 Ulcerative (chronic) rectosigmoiditis without complications: Secondary | ICD-10-CM | POA: Diagnosis not present

## 2022-07-06 MED ORDER — INFLUENZA VAC SPLIT QUAD 0.5 ML IM SUSY
PREFILLED_SYRINGE | INTRAMUSCULAR | 0 refills | Status: DC
  Filled 2022-07-06: qty 0.5, 1d supply, fill #0

## 2022-07-06 NOTE — Progress Notes (Signed)
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  Recall he was seen back in August with new symptoms of altered bowel habits with blood and mucus in his stools along with urgency and tenesmus.  He took occasional Advil but nothing routinely.  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  Labs were done which showed no anemia, normal hemoglobin, normal iron studies.  He underwent a colonoscopy with me in August showing proctitis.  I reviewed the pathology results with Dr. Unknown Foley.  There was no clear chronicity on the biopsies however was consistent with IBD and the acute onset setting.  He had tested negative for infection.  We placed him on oral Lialda and Canasa and over time he has had a nice improvement.  We had follow-up labs showing normal renal function in September.  He appears to be tolerating the regimen well.  He is currently on 4.8 g of Lialda per day and Canasa suppositories, he is using only once per week.  He does endorse having had mostly resolution of symptoms on most days.  He states occasionally he will have some breakthrough with some urgency at times but no blood or mucus.  He thinks spicier foods can trigger some of his symptoms and he tries to avoid that.  He denies any pains in his rectum.  No abdominal pains.  He is eating well.  His weight is stable.  He is not using any NSAIDs.  We discussed long-term course and plan moving forward.  He has not had any significant flares of symptoms since we started the regimen and generally doing much better than he had been pre-treatment.    Colonoscopy 04/26/22: The perianal and digital rectal examinations were normal. - The terminal ileum appeared normal. - Diffuse mild to moderate inflammation characterized by altered vascularity, erosions, erythema, friability and granularity was found in the rectum up through the distal rectosigmoid junction (roughly anal verge through distal 15cm  of colon). Biopsies were taken with a cold forceps for histology. - The exam was otherwise without abnormality. Retroflexed views of the rectum not obtained due to inflammation.   Surgical [P], colon, rectum - ACTIVE COLITIS/PROCTITIS, WITHOUT ASSOCIATED SIGNIFICANT CRYPT ARCHITECTURAL DISTORTION. - CMV IMMUNOHISTOCHEMICAL (IHC) STAIN NEGATIVE, WITH ADEQUATE CONTROLS. - NO DYSPLASIA OR MALIGNANCY IDENTIFIED.  I called Dr. Unknown Foley of pathology about the results.  He clearly has colitis/proctitis however no clear evidence of chronicity on this biopsy, but his symptoms are rather acute.  He stated the pathology results could certainly be consistent with ulcerative colitis in this setting.  The patient had tested negative for infectious etiology and ulcerative proctitis is the suspected likely diagnosis.   Done 05/21/22: - BUN 13, Cr 1.03, normal electrolytes.   Fecal calprotectin performed on September 28 - value of 91 borderline elevated.      Past Medical History:  Diagnosis Date   Depression    Ulcerative colitis (HCC)      Past Surgical History:  Procedure Laterality Date   NO PAST SURGERIES     Family History  Problem Relation Age of Onset   Colon polyps Mother    Irritable bowel syndrome Mother    Hyperlipidemia Father    Hypertension Father    Diabetes Maternal Uncle    Diabetes Maternal Grandmother    Hypertension Maternal Grandmother    Colon polyps Maternal Grandfather    Heart disease Paternal Grandmother    Hyperlipidemia Paternal Grandmother    Bladder  Cancer Paternal Grandfather    Prostate cancer Paternal Grandfather    Ulcerative colitis Cousin        meternal fisrt cousin   Colon cancer Maternal Great-grandmother    Diabetes Maternal Great-grandmother    Esophageal cancer Neg Hx    Stomach cancer Neg Hx    Rectal cancer Neg Hx    Social History   Tobacco Use   Smoking status: Never   Smokeless tobacco: Never  Vaping Use   Vaping Use: Never used   Substance Use Topics   Alcohol use: Yes    Comment: 1 every 3 days   Drug use: Not Currently    Types: Marijuana   Current Outpatient Medications  Medication Sig Dispense Refill   amphetamine-dextroamphetamine (ADDERALL XR) 15 MG 24 hr capsule Take 1 capsule by mouth in the morning. 30 capsule 0   ampicillin (PRINCIPEN) 500 MG capsule Take 1 capsule by mouth twice a day 60 capsule 6   ascorbic acid (VITAMIN C) 500 MG tablet Take 1 tablet by mouth daily.     clindamycin (CLINDAGEL) 1 % gel Apply 1 Application topically daily.     fluticasone (FLONASE ALLERGY RELIEF) 50 MCG/ACT nasal spray Place 1 Squirt into the nose daily.     hydrocortisone 2.5 % cream Apply 1 gram to skin twice a day to affected area twice a day for 7-10 days as needed 30 g 2   loratadine (CLARITIN) 10 MG tablet Take 1 tablet by mouth as needed.     mesalamine (CANASA) 1000 MG suppository Place 1 suppository (1,000 mg total) rectally at bedtime. 30 suppository 2   mesalamine (LIALDA) 1.2 g EC tablet Take 4 tablets (4.8 g total) by mouth daily with breakfast. 120 tablet 2   tretinoin (RETIN-A) 0.025 % cream Apply a pea-sized amount to face nightly 20 g 3   No current facility-administered medications for this visit.   No Known Allergies   Review of Systems: All systems reviewed and negative except where noted in HPI.   Labs attached in Epic  Physical Exam: BP 100/70   Pulse 62   Ht 5' 11.75" (1.822 m)   Wt 188 lb (85.3 kg)   BMI 25.68 kg/m  Constitutional: Pleasant,well-developed, male in no acute distress. Neurological: Alert and oriented to person place and time. Psychiatric: Normal mood and affect. Behavior is normal.   ASSESSMENT: 21 y.o. 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   He has responded appropriately to [place] although still has some mild intermittent symptoms, generally very improved.  We discussed UC at length, prognosis, risks of long-term colitis to include increased risk for colon cancer.  Main goals of therapy are to resolve his symptoms and allow him to function without knowing he has this, and to reduce his risk for colon cancer long-term.  If he responds to mesalamine that would be the preferred first-line regimen to treat this regimen.  If he does not respond long-term to mesalamine then will need to escalate to biologic therapy.  Of all options to treat his colitis I think Entyvio would be an excellent choice given its safety profile if we do need to escalate.  In this light, now that he has been on therapy for a few months, I am recommending a flex sig to reassess the colonic mucosa and  make sure he is in remission on mesalamine.  I asked him to take Lialda every day as well as using a suppository every day for now.  When he is home for winter break next month we will perform flex sig to reevaluate and take additional biopsies.  He is agreeable to take Lialda and Canasa every day.  His renal function is normal.  He will continue to avoid NSAIDs.  Flex sig will likely be done awake and less he prefers sedation at that time.  If he has active inflammation despite max dose mesalamine then we will need to discuss escalation of therapy.  He agrees  PLAN: - schedule flex sigmoidoscopy next month while on Lialda / Canas, likely done without sedation / awake - continue Lialda / Canasa until then - take suppository every day - avoid NSAIDs - long term can hopefully taper down lialda or Canasa frequency. If he has active inflammation on flex sig despite max mesalamine dosing will need to consider switch to biologic therapy  I spent 30 minutes of time, including in depth chart review, face-to-face time with the patient, coordinating care, and documentation  of the encounter.   Jolly Mango, MD [facility]

## 2022-07-06 NOTE — Patient Instructions (Addendum)
_______________________________________________________  If you are age 19 or older, your body mass index should be between 23-30. Your Body mass index is 25.68 kg/m. If this is out of the aforementioned range listed, please consider follow up with your Primary Care Provider.  If you are age 44 or younger, your body mass index should be between 19-25. Your Body mass index is 25.68 kg/m. If this is out of the aformentioned range listed, please consider follow up with your Primary Care Provider.   ________________________________________________________  The [facility] providers would like to encourage you to use [facility] to communicate with providers for non-urgent requests or questions.  Due to long hold times on the telephone, sending your provider a message by [facility] may be a faster and more efficient way to get a response.  Please allow 48 business hours for a response.  Please remember that this is for non-urgent requests.  _______________________________________________________  Paul [name] have been scheduled for a flexible sigmoidoscopy. Please follow the written instructions given to you at your visit today. If you use inhalers (even only as needed), please bring them with you on the day of your procedure.   Continue Lialda.  Continue Canasa - use daily at bedtime.  Thank you for entrusting me with your care and for choosing [facility], Dr. Huntley Cellar

## 2022-08-19 ENCOUNTER — Encounter: Payer: Self-pay | Admitting: Gastroenterology

## 2022-08-20 ENCOUNTER — Other Ambulatory Visit: Payer: Self-pay

## 2022-08-20 ENCOUNTER — Other Ambulatory Visit (HOSPITAL_BASED_OUTPATIENT_CLINIC_OR_DEPARTMENT_OTHER): Payer: Self-pay

## 2022-08-20 DIAGNOSIS — K921 Melena: Secondary | ICD-10-CM

## 2022-08-20 MED ORDER — MESALAMINE 1000 MG RE SUPP
1000.0000 mg | Freq: Every day | RECTAL | 2 refills | Status: DC
  Filled 2022-08-20: qty 30, 30d supply, fill #0

## 2022-08-20 NOTE — Progress Notes (Signed)
Refill of Canasa suppositories sent to [facility] per patient request.

## 2022-08-31 ENCOUNTER — Telehealth: Payer: Self-pay | Admitting: Gastroenterology

## 2022-08-31 ENCOUNTER — Encounter: Payer: Self-pay | Admitting: Certified Registered Nurse Anesthetist

## 2022-08-31 NOTE — Telephone Encounter (Signed)
Returned call to patient's mother. I informed her that we made Dr. Havery Moros aware and informed her that patient should follow written instructions that were provided at the time of his appt and NPO after midnight. Pt's mother verbalized understanding and had no concerns at the end of the call.

## 2022-08-31 NOTE — Telephone Encounter (Signed)
Received a call from patient's mother regarding sigmoidoscopy to be done tomorrow.  Patient DOES want sedation for the procedure tomorrow.  Thank you.

## 2022-08-31 NOTE — Telephone Encounter (Signed)
Okay thanks, no problem. He will just need to be NPO after MN. Thanks

## 2022-09-01 ENCOUNTER — Ambulatory Visit (AMBULATORY_SURGERY_CENTER): Payer: BC Managed Care – PPO | Admitting: Gastroenterology

## 2022-09-01 ENCOUNTER — Encounter: Payer: Self-pay | Admitting: Gastroenterology

## 2022-09-01 VITALS — BP 100/49 | HR 53 | Temp 98.9°F | Resp 12 | Ht 71.0 in | Wt 188.0 lb

## 2022-09-01 DIAGNOSIS — Z0389 Encounter for observation for other suspected diseases and conditions ruled out: Secondary | ICD-10-CM | POA: Diagnosis not present

## 2022-09-01 DIAGNOSIS — K51319 Ulcerative (chronic) rectosigmoiditis with unspecified complications: Secondary | ICD-10-CM | POA: Diagnosis not present

## 2022-09-01 MED ORDER — SODIUM CHLORIDE 0.9 % IV SOLN: 500.0000 mL | INTRAVENOUS | Status: DC

## 2022-09-01 NOTE — Progress Notes (Signed)
[facility] History and Physical   Primary Care Physician:  Elnita Maxwell, MD   Reason for Procedure:   Ulcerative colitis  Plan:    Flex sig     HPI: Paul [name] is a 22 y.o. male  here for flex sig to survey distal colitis. Diagnosed this past August. Now on Lialda and Canasa and doing well.  Otherwise feels well without any cardiopulmonary symptoms.   I have discussed risks / benefits of anesthesia and endoscopic procedure with Clydene Pugh and they wish to proceed with the exams as outlined today.    Past Medical History:  Diagnosis Date   Depression    Ulcerative colitis ([place])     Past Surgical History:  Procedure Laterality Date   NO PAST SURGERIES      Prior to Admission medications   Medication Sig Start Date End Date Taking? Authorizing Provider  amphetamine-dextroamphetamine (ADDERALL XR) 15 MG 24 hr capsule Take 1 capsule by mouth in the morning. 05/27/22  Yes   ascorbic acid (VITAMIN C) 500 MG tablet Take 1 tablet by mouth daily. 02/05/21  Yes [provider]  mesalamine (CANASA) 1000 MG suppository Place 1 suppository (1,000 mg total) rectally at bedtime. 08/20/22  Yes Deasha Clendenin, Carlota Raspberry, MD  mesalamine (LIALDA) 1.2 g EC tablet Take 4 tablets (4.8 g total) by mouth daily with breakfast. 06/07/22  Yes Yizel Canby, Carlota Raspberry, MD  ampicillin (PRINCIPEN) 500 MG capsule Take 1 capsule by mouth twice a day 07/02/22     clindamycin (CLINDAGEL) 1 % gel Apply 1 Application topically daily. 09/03/20   [provider]  fluticasone Asencion Islam ALLERGY RELIEF) 50 MCG/ACT nasal spray Place 1 Squirt into the nose daily. 09/03/20   [provider]  hydrocortisone 2.5 % cream Apply 1 gram to skin twice a day to affected area twice a day for 7-10 days as needed 04/05/22     influenza vac split quadrivalent PF (FLUARIX) 0.5 ML injection Inject into the muscle. 07/06/22   Carlyle Basques, MD  loratadine (CLARITIN) 10 MG tablet Take 1 tablet by mouth  as needed. 09/03/20   [provider]  tretinoin (RETIN-A) 0.025 % cream Apply a pea-sized amount to face nightly 08/06/21       Current Outpatient Medications  Medication Sig Dispense Refill   amphetamine-dextroamphetamine (ADDERALL XR) 15 MG 24 hr capsule Take 1 capsule by mouth in the morning. 30 capsule 0   ascorbic acid (VITAMIN C) 500 MG tablet Take 1 tablet by mouth daily.     mesalamine (CANASA) 1000 MG suppository Place 1 suppository (1,000 mg total) rectally at bedtime. 30 suppository 2   mesalamine (LIALDA) 1.2 g EC tablet Take 4 tablets (4.8 g total) by mouth daily with breakfast. 120 tablet 2   ampicillin (PRINCIPEN) 500 MG capsule Take 1 capsule by mouth twice a day 60 capsule 6   clindamycin (CLINDAGEL) 1 % gel Apply 1 Application topically daily.     fluticasone (FLONASE ALLERGY RELIEF) 50 MCG/ACT nasal spray Place 1 Squirt into the nose daily.     hydrocortisone 2.5 % cream Apply 1 gram to skin twice a day to affected area twice a day for 7-10 days as needed 30 g 2   influenza vac split quadrivalent PF (FLUARIX) 0.5 ML injection Inject into the muscle. 0.5 mL 0   loratadine (CLARITIN) 10 MG tablet Take 1 tablet by mouth as needed.     tretinoin (RETIN-A) 0.025 % cream Apply a pea-sized amount to face nightly 20  g 3   Current Facility-Administered Medications  Medication Dose Route Frequency Provider Last Rate Last Admin   0.9 %  sodium chloride infusion  500 mL Intravenous Continuous Darnell Stimson, Carlota Raspberry, MD        Allergies as of 09/01/2022   (No Known Allergies)    Family History  Problem Relation Age of Onset   Colon polyps Mother    Irritable bowel syndrome Mother    Hyperlipidemia Father    Hypertension Father    Diabetes Maternal Uncle    Diabetes Maternal Grandmother    Hypertension Maternal Grandmother    Colon polyps Maternal Grandfather    Heart disease Paternal Grandmother    Hyperlipidemia Paternal Grandmother    Bladder Cancer Paternal  Grandfather    Prostate cancer Paternal Grandfather    Ulcerative colitis Cousin        meternal fisrt cousin   Colon cancer Maternal Great-grandmother    Diabetes Maternal Great-grandmother    Esophageal cancer Neg Hx    Stomach cancer Neg Hx    Rectal cancer Neg Hx     Social History   Socioeconomic History   Marital status: Single    Spouse name: Not on file   Number of children: 0   Years of education: Not on file   Highest education level: Not on file  Occupational History   Occupation: student  Tobacco Use   Smoking status: Never   Smokeless tobacco: Never  Vaping Use   Vaping Use: Never used  Substance and Sexual Activity   Alcohol use: Yes    Comment: 1 every 3 days   Drug use: Not Currently    Types: Marijuana   Sexual activity: Not on file  Other Topics Concern   Not on file  Social History Narrative   Not on file   Social Determinants of Health   Financial Resource Strain: Not on file  Food Insecurity: Not on file  Transportation Needs: Not on file  Physical Activity: Not on file  Stress: Not on file  Social Connections: Not on file  Intimate Partner Violence: Not on file    Review of Systems: All other review of systems negative except as mentioned in the HPI.  Physical Exam: Vital signs BP 104/61   Pulse (!) 54   Temp 98.9 F (37.2 C)   Resp 12   Ht 5\' 11"  (1.803 m)   Wt 188 lb (85.3 kg)   SpO2 100%   BMI 26.22 kg/m   General:   Alert,  Well-developed, pleasant and cooperative in NAD Lungs:  Clear throughout to auscultation.   Heart:  Regular rate and rhythm Abdomen:  Soft, nontender and nondistended.   Neuro/Psych:  Alert and cooperative. Normal mood and affect. A and O x 3  Jolly Mango, MD [facility]

## 2022-09-01 NOTE — Progress Notes (Signed)
Patient reports no changes to health or medications since office visit 06/2022.

## 2022-09-01 NOTE — Progress Notes (Signed)
Called to room to assist during endoscopic procedure.  Patient ID and intended procedure confirmed with present staff. Received instructions for my participation in the procedure from the performing physician.  

## 2022-09-01 NOTE — Op Note (Signed)
[facility] Patient Name: Paul [name] Procedure Date: 09/01/2022 10:29 AM MRN: [id number] Endoscopist: Remo Lipps P. 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 Now on Lialda / Canasa and clinically                            doing well. Flex sig to follow up and assess for                            mucosal healing Medicines:                Monitored Anesthesia Care Procedure:                Pre-Anesthesia Assessment:                           - Prior to the procedure, a History and Physical                            was performed, and patient medications and                            allergies were reviewed. The patient's tolerance of                            previous anesthesia was also reviewed. The risks                            and benefits of the procedure and the sedation                            options and risks were discussed with the patient.                            All questions were answered, and informed consent                            was obtained. Prior Anticoagulants: The patient has                            taken no anticoagulant or antiplatelet agents. ASA                            Grade Assessment: II - A patient with mild systemic                            disease. After reviewing the risks and benefits,  the patient was deemed in satisfactory condition to                            undergo the procedure.                           After obtaining informed consent, the scope was                            passed under direct vision. The Endoscope was                            introduced through the anus and advanced to the the                            sigmoid colon. The flexible  sigmoidoscopy was                            accomplished without difficulty. The patient                            tolerated the procedure well. The quality of the                            bowel preparation was adequate. Scope In: Scope Out: Findings:                 The perianal and digital rectal examinations were                            normal.                           The rectum, recto-sigmoid colon and sigmoid colon                            appeared normal for the most part. One focal area                            of possible mild / faint inflammation in the distal                            rectum. Generally significant improvement from the                            prior exam. Biopsies were taken with a cold forceps                            for histology. Complications:            No immediate complications. Estimated blood loss:                            Minimal. Estimated Blood Loss:     Estimated blood loss was minimal. Impression:               -  Interval healing of prior active inflammation -                            generally normal exam with one focal area of                            possible faint inflammation. Biopsied. Recommendation:           - Discharge patient to home.                           - Resume previous diet.                           - Continue present medications. Will discuss with                            patient if he wishes to try Canasa monotherapy (may                            work better) vs. continuing Lialda and using Canasa                            a few times weekly, with more frequent use if                            symptoms recur                           - Await pathology results. Remo Lipps P. [name], MD 09/01/2022 11:01:01 AM This report has been signed electronically.

## 2022-09-01 NOTE — Patient Instructions (Addendum)
Discharge patient to home. - Resume previous diet. - Continue present medications. Will discuss with patient if he wishes to try Canasa monotherapy (may work better) vs. continuing Lialda and using Canasa a few times weekly, with more frequent use if symptoms recur - Await pathology results.   YOU HAD AN ENDOSCOPIC PROCEDURE TODAY AT [facility]:   Refer to the procedure report that was given to you for any specific questions about what was found during the examination.  If the procedure report does not answer your questions, please call your gastroenterologist to clarify.  If you requested that your care partner not be given the details of your procedure findings, then the procedure report has been included in a sealed envelope for you to review at your convenience later.  YOU SHOULD EXPECT: Some feelings of bloating in the abdomen. Passage of more gas than usual.  Walking can help get rid of the air that was put into your GI tract during the procedure and reduce the bloating. If you had a lower endoscopy (such as a colonoscopy or flexible sigmoidoscopy) you may notice spotting of blood in your stool or on the toilet paper. If you underwent a bowel prep for your procedure, you may not have a normal bowel movement for a few days.  Please Note:  You might notice some irritation and congestion in your nose or some drainage.  This is from the oxygen used during your procedure.  There is no need for concern and it should clear up in a day or so.  SYMPTOMS TO REPORT IMMEDIATELY:  Following lower endoscopy (colonoscopy or flexible sigmoidoscopy):  Excessive amounts of blood in the stool  Significant tenderness or worsening of abdominal pains  Swelling of the abdomen that is new, acute  Fever of 100F or higher   For urgent or emergent issues, a gastroenterologist can be reached at any hour by calling [phone]. Do not use MyChart messaging for urgent concerns.    DIET:  We  do recommend a small meal at first, but then you may proceed to your regular diet.  Drink plenty of fluids but you should avoid alcoholic beverages for 24 hours.  ACTIVITY:  You should plan to take it easy for the rest of today and you should NOT DRIVE or use heavy machinery until tomorrow (because of the sedation medicines used during the test).    FOLLOW UP: Our staff will call the number listed on your records the next business day following your procedure.  We will call around 7:15- 8:00 am to check on you and address any questions or concerns that you may have regarding the information given to you following your procedure. If we do not reach you, we will leave a message.     If any biopsies were taken you will be contacted by phone or by letter within the next 1-3 weeks.  Please call us at [phone] if you have not heard about the biopsies in 3 weeks.    SIGNATURES/CONFIDENTIALITY: You and/or your care partner have signed paperwork which will be entered into your electronic medical record.  These signatures attest to the fact that that the information above on your After Visit Summary has been reviewed and is understood.  Full responsibility of the confidentiality of this discharge information lies with you and/or your care-partner.

## 2022-09-01 NOTE — Progress Notes (Signed)
Report given to PACU, vss 

## 2022-09-02 ENCOUNTER — Telehealth: Payer: Self-pay | Admitting: *Deleted

## 2022-09-02 NOTE — Telephone Encounter (Signed)
Post procedure follow up call placed, no answer and left VM.  

## 2022-09-25 DIAGNOSIS — M79641 Pain in right hand: Secondary | ICD-10-CM | POA: Diagnosis not present

## 2022-10-08 DIAGNOSIS — J029 Acute pharyngitis, unspecified: Secondary | ICD-10-CM | POA: Diagnosis not present

## 2022-10-08 DIAGNOSIS — Z1152 Encounter for screening for COVID-19: Secondary | ICD-10-CM | POA: Diagnosis not present

## 2022-10-08 DIAGNOSIS — Z1159 Encounter for screening for other viral diseases: Secondary | ICD-10-CM | POA: Diagnosis not present

## 2022-10-28 ENCOUNTER — Other Ambulatory Visit: Payer: Self-pay | Admitting: Gastroenterology

## 2022-10-28 DIAGNOSIS — K51011 Ulcerative (chronic) pancolitis with rectal bleeding: Secondary | ICD-10-CM

## 2022-11-01 ENCOUNTER — Encounter: Payer: Self-pay | Admitting: Gastroenterology

## 2022-11-22 ENCOUNTER — Other Ambulatory Visit: Payer: Self-pay | Admitting: Gastroenterology

## 2022-11-22 DIAGNOSIS — K921 Melena: Secondary | ICD-10-CM

## 2022-12-02 ENCOUNTER — Telehealth: Payer: Self-pay | Admitting: Gastroenterology

## 2022-12-02 MED ORDER — PREDNISONE 10 MG PO TABS: ORAL_TABLET | ORAL | 0 refills | Status: AC

## 2022-12-02 NOTE — Telephone Encounter (Signed)
Patient called stated he has been having issues with UC and is seeking advise, not sure if he needs to get an order for lab work.

## 2022-12-02 NOTE — Telephone Encounter (Signed)
I called the patient.  He says this is similar to the symptoms when he was diagnosed, I just wanted to clarify a little bit more no antibiotics so I do not think this is C. difficile.  I sent in a prednisone taper given that he is on Canasa and high-dose Lialda.  Side effects discussed.  He has upcoming follow-up in May, with Amy Esterwood PA-C.  Meds ordered this encounter  Medications   predniSONE (DELTASONE) 10 MG tablet    Sig: Take 4 tablets (40 mg total) by mouth daily with breakfast for 3 days, THEN 3 tablets (30 mg total) daily with breakfast for 3 days, THEN 2 tablets (20 mg total) daily with breakfast for 7 days, THEN 1 tablet (10 mg total) daily with breakfast for 7 days, THEN 0.5 tablets (5 mg total) daily with breakfast for 7 days.    Dispense:  46 tablet    Refill:  0

## 2022-12-02 NOTE — Telephone Encounter (Signed)
Noted, thanks!

## 2022-12-02 NOTE — Telephone Encounter (Signed)
Dr. Carlean Purl, as DOD PM of 12/02/22. Please advise, thanks.   Dr. 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  Flex sig completed in 08/2022 - biopsies were normal. Last Fecal calprotectin was in 04/2022.   Pt returned call. Pt reports feeling like he is having a UC flare. Symptoms have worsened in the last 2 weeks. Pt reports BRB with wiping, urgency, and loose stools. Pt reported that he has not seen blood in the stool for the past 3-4 days. Denies any abdominal pain, fever, or mucous in the stool. Pt reports that the consistency of his stools vary from loose to solids, he has about 4-5 stools a day. Patient confirmed that he is using Canasa suppositories nightly and taking 4.8 g of Lialda daily, pt is not seeing any improvement of his symptoms. Patient wants to know if he can come in for labs or if he needs to complete any stool test.

## 2022-12-02 NOTE — Telephone Encounter (Signed)
Left vm for patient to return call 

## 2022-12-02 NOTE — Addendum Note (Signed)
Addended by: Gatha Mayer on: 12/02/2022 03:51 PM   Modules accepted: Orders

## 2023-01-07 ENCOUNTER — Ambulatory Visit: Payer: BC Managed Care – PPO | Admitting: Physician Assistant

## 2023-01-07 ENCOUNTER — Other Ambulatory Visit (INDEPENDENT_AMBULATORY_CARE_PROVIDER_SITE_OTHER): Payer: BC Managed Care – PPO

## 2023-01-07 ENCOUNTER — Other Ambulatory Visit (HOSPITAL_BASED_OUTPATIENT_CLINIC_OR_DEPARTMENT_OTHER): Payer: Self-pay

## 2023-01-07 ENCOUNTER — Encounter: Payer: Self-pay | Admitting: Physician Assistant

## 2023-01-07 VITALS — BP 110/60 | HR 74 | Ht 72.0 in | Wt 191.0 lb

## 2023-01-07 DIAGNOSIS — K51319 Ulcerative (chronic) rectosigmoiditis with unspecified complications: Secondary | ICD-10-CM

## 2023-01-07 DIAGNOSIS — K51011 Ulcerative (chronic) pancolitis with rectal bleeding: Secondary | ICD-10-CM

## 2023-01-07 LAB — CBC WITH DIFFERENTIAL/PLATELET
Basophils Absolute: 0 10*3/uL (ref 0.0–0.1)
Basophils Relative: 0.5 % (ref 0.0–3.0)
Eosinophils Absolute: 0.2 10*3/uL (ref 0.0–0.7)
Eosinophils Relative: 2.5 % (ref 0.0–5.0)
HCT: 43.6 % (ref 36.0–49.0)
Hemoglobin: 15.2 g/dL (ref 12.0–16.0)
Lymphocytes Relative: 33.8 % (ref 24.0–48.0)
Lymphs Abs: 2.1 10*3/uL (ref 0.7–4.0)
MCHC: 34.9 g/dL (ref 31.0–37.0)
MCV: 84.5 fl (ref 78.0–98.0)
Monocytes Absolute: 0.6 10*3/uL (ref 0.1–1.0)
Monocytes Relative: 10.5 % (ref 3.0–12.0)
Neutro Abs: 3.3 10*3/uL (ref 1.4–7.7)
Neutrophils Relative %: 52.7 % (ref 43.0–71.0)
Platelets: 221 10*3/uL (ref 150.0–575.0)
RBC: 5.16 Mil/uL (ref 3.80–5.70)
RDW: 12.4 % (ref 11.4–15.5)
WBC: 6.2 10*3/uL (ref 4.5–13.5)

## 2023-01-07 LAB — BASIC METABOLIC PANEL
BUN: 19 mg/dL (ref 6–23)
CO2: 27 mEq/L (ref 19–32)
Calcium: 9.6 mg/dL (ref 8.4–10.5)
Chloride: 107 mEq/L (ref 96–112)
Creatinine, Ser: 1.06 mg/dL (ref 0.40–1.50)
GFR: 101.53 mL/min (ref >60.00)
Glucose, Bld: 80 mg/dL (ref 70–99)
Potassium: 3.9 mEq/L (ref 3.5–5.1)
Sodium: 144 mEq/L (ref 135–145)

## 2023-01-07 LAB — SEDIMENTATION RATE: Sed Rate: 1 mm/hr (ref 0–15)

## 2023-01-07 LAB — C-REACTIVE PROTEIN: CRP: <1 mg/dL (ref 0.5–20.0)

## 2023-01-07 MED ORDER — PREDNISONE 10 MG PO TABS
ORAL_TABLET | ORAL | 0 refills | Status: AC
  Filled 2023-01-07: qty 100, 42d supply, fill #0

## 2023-01-07 NOTE — Addendum Note (Signed)
Addended by: Jethro Bastos, Alan Riles I on: 01/07/2023 09:47 AM   Modules accepted: Orders

## 2023-01-07 NOTE — Patient Instructions (Addendum)
_______________________________________________________  If your blood pressure at your visit was 140/90 or greater, please contact your primary care physician to follow up on this.  If you are age 20 or younger, your body mass index should be between 19-25. Your Body mass index is 25.9 kg/m. If this is out of the aformentioned range listed, please consider follow up with your Primary Care Provider.  ________________________________________________________  The [facility] providers would like to encourage you to use [facility] to communicate with providers for non-urgent requests or questions.  Due to long hold times on the telephone, sending your provider a message by [facility] may be a faster and more efficient way to get a response.  Please allow 48 business hours for a response.  Please remember that this is for non-urgent requests.  _______________________________________________________  Your provider has requested that you go to the basement level for lab work before leaving today. Press "B" on the elevator. The lab is located at the first door on the left as you exit the elevator.  Due to recent changes in healthcare laws, you may see the results of your imaging and laboratory studies on MyChart before your provider has had a chance to review them.  We understand that in some cases there may be results that are confusing or concerning to you. Not all laboratory results come back in the same time frame and the provider may be waiting for multiple results in order to interpret others.  Please give [place] 48 hours in order for your provider to thoroughly review all the results before contacting the office for clarification of your results.   CONTINUE: Lialda 1.2mg  take 4 tablets daily CONTINUE: Canasa suppositories every night at bedtime  We have sent the following medications to your pharmacy for you to pick up at your convenience:  START: Prednisone 10mg  take 2 tablets per day (20mg ) for 2 weeks,  then 10mg  take 1.5 tablets per day (15mg ) for 2 weeks, then 10mg  take 1 per tablet day (10mg ) for 2 weeks, then stop.  You are scheduled to follow up with Dr Adela Lank on 03-31-23 at 3:20pm  Please call our office or send a MyChart message if symptoms are not improved with steroids.   Thank you for entrusting me with your care and choosing [facility].  Amy Esterwood, PA-C

## 2023-01-07 NOTE — Progress Notes (Signed)
Subjective:    Patient ID: Paul [name], male    DOB: Oct 21, 2000, 22 y.o.   MRN: [id number]  HPI Paul [name] is a pleasant 22 year old white male, established with Dr. Adela Lank with diagnosis of ulcerative rectosigmoiditis.  He was last seen in November 2023 and has been managed with Lialda 4.8 g daily and Canasa suppositories at bedtime.  He underwent flexible sigmoidoscopy for assessment of healing after he had initially been diagnosed last summer.  Flexible sigmoidoscopy was normal with the exception of 1 focal area of possible mild/faint inflammation in the distal rectum.  Biopsy showed no significant chronic changes or active inflammation. He has continued on Lialda 4.8 g daily, and Canasa suppositories at bedtime daily. Comes in today with concerns about recent flare of symptoms that coincided with increased stress prior to taking finals at [employer] state.  He denied any associated abdominal pain but started noticing blood with his bowel movements on a fairly regular basis which lasted for about 1-1/2 to 2 weeks in early April.  He may also have been having some increased frequency of bowel movements but no diarrhea, some urgency.  Denies any abdominal pain or cramping, appetite has been fine, no nausea or fatigue. Since he called here for appointment the bleeding resolved and he has not noticed any blood since mid April.  Continues to have some increased frequency of bowel movements usually 2 to 3/day occurring after meals he also continues to have some urgency and vague rectal discomfort, denies any abdominal discomfort cramping or pain.  Energy level has been fine. After he had called here a course of prednisone has been called in for him however as he was feeling better and the bleeding had resolved he did not take the prednisone. He will be home for the summer and then return to school in August.  Review of Systems  Pertinent positive and negative review of systems were noted in the  above HPI section.  All other review of systems was otherwise negative.   Outpatient Encounter Medications as of 01/07/2023  Medication Sig   amphetamine-dextroamphetamine (ADDERALL XR) 15 MG 24 hr capsule Take 1 capsule by mouth in the morning.   ampicillin (PRINCIPEN) 500 MG capsule Take 1 capsule by mouth twice a day   clindamycin (CLINDAGEL) 1 % gel Apply 1 Application topically daily.   fluticasone (FLONASE ALLERGY RELIEF) 50 MCG/ACT nasal spray Place 1 Squirt into the nose daily.   hydrocortisone 2.5 % cream Apply 1 gram to skin twice a day to affected area twice a day for 7-10 days as needed   influenza vac split quadrivalent PF (FLUARIX) 0.5 ML injection Inject into the muscle.   mesalamine (CANASA) 1000 MG suppository PLACE 1 SUPPOSITORY RECTALLY NIGHTLY AT BEDTIME   mesalamine (LIALDA) 1.2 g EC tablet TAKE 4 TABLETS BY MOUTH DAILY WITH BREKFAST   predniSONE (DELTASONE) 10 MG tablet Take 2 tablets (20 mg total) by mouth daily with breakfast for 14 days, THEN 1.5 tablets (15 mg total) daily with breakfast for 14 days, THEN 1 tablet (10 mg total) daily with breakfast for 14 days.   tretinoin (RETIN-A) 0.025 % cream Apply a pea-sized amount to face nightly   [DISCONTINUED] ascorbic acid (VITAMIN C) 500 MG tablet Take 1 tablet by mouth daily.   [DISCONTINUED] loratadine (CLARITIN) 10 MG tablet Take 1 tablet by mouth as needed.   No facility-administered encounter medications on file as of 01/07/2023.   No Known Allergies Patient Active Problem List   Diagnosis Date  Noted   Elevated bilirubin 04/22/2022   Rectal bleeding 04/22/2022   Social History   Socioeconomic History   Marital status: Single    Spouse name: Not on file   Number of children: 0   Years of education: Not on file   Highest education level: Not on file  Occupational History   Occupation: student  Tobacco Use   Smoking status: Never   Smokeless tobacco: Never  Vaping Use   Vaping Use: Never used  Substance  and Sexual Activity   Alcohol use: Yes    Comment: 1 every 3 days   Drug use: Not Currently    Types: Marijuana   Sexual activity: Not on file  Other Topics Concern   Not on file  Social History Narrative   Not on file   Social Determinants of Health   Financial Resource Strain: Not on file  Food Insecurity: Not on file  Transportation Needs: Not on file  Physical Activity: Not on file  Stress: Not on file  Social Connections: Not on file  Intimate Partner Violence: Not on file    Mr. Wiesemann family history includes Bladder Cancer in his paternal grandfather; Colon cancer in his maternal great-grandmother; Colon polyps in his maternal grandfather and mother; Diabetes in his maternal grandmother, maternal great-grandmother, and maternal uncle; Heart disease in his paternal grandmother; Hyperlipidemia in his father and paternal grandmother; Hypertension in his father and maternal grandmother; Irritable bowel syndrome in his mother; Prostate cancer in his paternal grandfather; Ulcerative colitis in his cousin.      Objective:    Vitals:   01/07/23 0904  BP: 110/60  Pulse: 74    Physical Exam Well-developed well-nourished fyoung WM in no acute distress.  Accompanied by mother height, Weight, 191 BMI 25.9  HEENT; nontraumatic normocephalic, EOMI, PE R LA, sclera anicteric. Oropharynx; not examined today Neck; supple, no JVD Cardiovascular; regular rate and rhythm with S1-S2, no murmur rub or gallop Pulmonary; Clear bilaterally Abdomen; soft, very minimally tender deep in the left lower quadrant no guarding ,nondistended, no palpable mass or hepatosplenomegaly, bowel sounds are active Rectal; not done today Skin; benign exam, no jaundice rash or appreciable lesions Extremities; no clubbing cyanosis or edema skin warm and dry Neuro/Psych; alert and oriented x4, grossly nonfocal mood and affect appropriate        Assessment & Plan:   #[age] year old white male with  diagnosis of ulcerative rectosigmoiditis, initial diagnosis August 2023. He had flexible sigmoidoscopy in January 2024 for reassessment of disease activity and exam was normal with the exception of 1 focal area of possible mild inflammation in the distal rectum.  Biopsies showed no significant chronic changes or active inflammation. He has continued on maintenance therapy with Lialda 4.8 g daily, and Canasa suppository 1 at bedtime.  He had a flare of symptoms with bright red blood mixed with bowel movements in early April which lasted for a week and 1/2 to 2 weeks.  No associated abdominal pain or diarrhea, has had some increased frequency of stools as well. This occurred with increased stress prior to taking finals at college.  He has not seen any blood since mid April, continues to have some increased frequency of bowel movements usually postprandially and some urgency but no diarrhea, vague rectal tenderness, no complaints of abdominal pain or tenderness and otherwise feels fine.  I suspect he has had a mild exacerbation, and though better he is not completely returned to baseline  Plan; sed rate, CRP, CBC  with differential and fecal calprotectin Will start prednisone 10 mg tablets-20 mg/day x 2 weeks then decrease to 15 mg daily x 2 weeks then decrease to 10 mg daily x 2 weeks then stop Continue Lialda 4.8 g daily Continue Canasa suppositories 1 at bedtime I have asked him to call if he does not have good resolution of symptoms with the tapered course of prednisone, or if he fails to improve on prednisone. He will have follow-up office visit with Dr. Adela Lank in early August, I am happy to see him in the interim if needed. Biologics had been discussed in the past with the patient per notes but hopefully escalation  of therapy will not be needed.   Taisha Pennebaker S Metta Koranda PA-C 01/07/2023   Cc: Eliberto Ivory, MD

## 2023-01-09 NOTE — Progress Notes (Signed)
Agree with assessment and plan as outlined.  Amy can you relay results to me once fecal calprotectin is back. Will see how he does on this regimen. If fecal calprotectin is high and he is not able to be controlled with mesalamine monotherapy we may need to consider biologic therapy.

## 2023-01-10 ENCOUNTER — Other Ambulatory Visit (HOSPITAL_BASED_OUTPATIENT_CLINIC_OR_DEPARTMENT_OTHER): Payer: Self-pay

## 2023-01-10 ENCOUNTER — Other Ambulatory Visit: Payer: Self-pay | Admitting: Gastroenterology

## 2023-01-10 DIAGNOSIS — K51011 Ulcerative (chronic) pancolitis with rectal bleeding: Secondary | ICD-10-CM

## 2023-01-10 MED ORDER — MESALAMINE 1.2 G PO TBEC
4.8000 g | DELAYED_RELEASE_TABLET | Freq: Every day | ORAL | 3 refills | Status: DC
Start: 2023-01-10 — End: 2023-10-04
  Filled 2023-01-10: qty 120, 30d supply, fill #0
  Filled 2023-02-12: qty 120, 30d supply, fill #1
  Filled 2023-03-17: qty 120, 30d supply, fill #2
  Filled 2023-05-29 – 2023-08-22 (×2): qty 120, 30d supply, fill #3

## 2023-01-14 LAB — CALPROTECTIN, FECAL: Calprotectin, Fecal: 49 ug/g (ref 0–120)

## 2023-01-17 ENCOUNTER — Other Ambulatory Visit (HOSPITAL_BASED_OUTPATIENT_CLINIC_OR_DEPARTMENT_OTHER): Payer: Self-pay

## 2023-01-17 ENCOUNTER — Other Ambulatory Visit: Payer: Self-pay | Admitting: Gastroenterology

## 2023-01-17 DIAGNOSIS — K921 Melena: Secondary | ICD-10-CM

## 2023-01-17 MED ORDER — MESALAMINE 1000 MG RE SUPP
1000.0000 mg | Freq: Every day | RECTAL | 5 refills | Status: DC
Start: 2023-01-17 — End: 2023-07-08
  Filled 2023-01-17 – 2023-01-27 (×2): qty 30, 30d supply, fill #0
  Filled 2023-02-25: qty 30, 30d supply, fill #1
  Filled 2023-03-25: qty 30, 30d supply, fill #2
  Filled 2023-05-29: qty 30, 30d supply, fill #3

## 2023-01-17 MED ORDER — AMPHETAMINE-DEXTROAMPHET ER 15 MG PO CP24
15.0000 mg | ORAL_CAPSULE | Freq: Every morning | ORAL | 0 refills | Status: DC
  Filled 2023-01-17: qty 30, 30d supply, fill #0

## 2023-01-27 ENCOUNTER — Other Ambulatory Visit (HOSPITAL_BASED_OUTPATIENT_CLINIC_OR_DEPARTMENT_OTHER): Payer: Self-pay

## 2023-02-12 ENCOUNTER — Other Ambulatory Visit (HOSPITAL_BASED_OUTPATIENT_CLINIC_OR_DEPARTMENT_OTHER): Payer: Self-pay

## 2023-02-13 ENCOUNTER — Other Ambulatory Visit (HOSPITAL_BASED_OUTPATIENT_CLINIC_OR_DEPARTMENT_OTHER): Payer: Self-pay

## 2023-02-13 MED ORDER — AMPHETAMINE-DEXTROAMPHETAMINE 15 MG PO TABS
15.0000 mg | ORAL_TABLET | Freq: Every morning | ORAL | 0 refills | Status: DC
  Filled 2023-02-14: qty 30, 30d supply, fill #0

## 2023-02-14 ENCOUNTER — Other Ambulatory Visit: Payer: Self-pay

## 2023-02-14 ENCOUNTER — Other Ambulatory Visit (HOSPITAL_BASED_OUTPATIENT_CLINIC_OR_DEPARTMENT_OTHER): Payer: Self-pay

## 2023-02-26 ENCOUNTER — Other Ambulatory Visit: Payer: Self-pay

## 2023-02-28 ENCOUNTER — Ambulatory Visit: Payer: BC Managed Care – PPO | Admitting: Family Medicine

## 2023-02-28 ENCOUNTER — Encounter: Payer: Self-pay | Admitting: Family Medicine

## 2023-02-28 VITALS — BP 120/80 | HR 79 | Temp 98.0°F | Ht 72.0 in | Wt 186.4 lb

## 2023-02-28 DIAGNOSIS — L709 Acne, unspecified: Secondary | ICD-10-CM | POA: Diagnosis not present

## 2023-02-28 DIAGNOSIS — J309 Allergic rhinitis, unspecified: Secondary | ICD-10-CM | POA: Diagnosis not present

## 2023-02-28 DIAGNOSIS — K51919 Ulcerative colitis, unspecified with unspecified complications: Secondary | ICD-10-CM | POA: Diagnosis not present

## 2023-02-28 DIAGNOSIS — K5190 Ulcerative colitis, unspecified, without complications: Secondary | ICD-10-CM | POA: Insufficient documentation

## 2023-02-28 DIAGNOSIS — F988 Other specified behavioral and emotional disorders with onset usually occurring in childhood and adolescence: Secondary | ICD-10-CM | POA: Diagnosis not present

## 2023-02-28 NOTE — Patient Instructions (Signed)
It was very nice to see you today!  Please let us know if you need refill on medications.  Please continue to work on diet and exercise.  Return in about 6 months (around 08/31/2023).   Take care, Dr Jimmey Ralph  PLEASE NOTE:  If you had any lab tests, please let us know if you have not heard back within a few days. You may see your results on mychart before we have a chance to review them but we will give you a call once they are reviewed by [place].   If we ordered any referrals today, please let us know if you have not heard from their office within the next week.   If you had any urgent prescriptions sent in today, please check with the pharmacy within an hour of our visit to make sure the prescription was transmitted appropriately.   Please try these tips to maintain a healthy lifestyle:  Eat at least 3 REAL meals and 1-2 snacks per day.  Aim for no more than 5 hours between eating.  If you eat breakfast, please do so within one hour of getting up.   Each meal should contain half fruits/vegetables, one quarter protein, and one quarter carbs (no bigger than a computer mouse)  Cut down on sweet beverages. This includes juice, soda, and sweet tea.   Drink at least 1 glass of water with each meal and aim for at least 8 glasses per day  Exercise at least 150 minutes every week.

## 2023-02-28 NOTE — Assessment & Plan Note (Signed)
Database without red flags.  Able to see previous prescriptions in PDMP.  He would like for [place] to take over prescriptions.  We did discuss controlled substance policy.  Controlled substance agreement was reviewed, signed, and scanned into chart.  He has been doing well on Adderall XR 15 mg daily for the last several years.  Will plan on continuing this.  He will let us know when he needs a refill.  Does not need refill today.  Follow-up for in person visit in 3 to 6 months.

## 2023-02-28 NOTE — Progress Notes (Signed)
Paul [name] is a 22 y.o. male who presents today for an office visit.  Assessment/Plan:  Chronic Problems Addressed Today: Attention deficit disorder (ADD) Database without red flags.  Able to see previous prescriptions in PDMP.  He would like for [place] to take over prescriptions.  We did discuss controlled substance policy.  Controlled substance agreement was reviewed, signed, and scanned into chart.  He has been doing well on Adderall XR 15 mg daily for the last several years.  Will plan on continuing this.  He will let us know when he needs a refill.  Does not need refill today.  Follow-up for in person visit in 3 to 6 months.  Ulcerative colitis (HCC) Follows with GI.  Currently on mesalamine suppository and mesalamine by mouth daily.  Allergic rhinitis On Flonase daily.  Acne Follows with dermatology.  On ampicillin by mouth twice daily and clindamycin gel topically twice daily.     Subjective:  HPI:  See A/P for status of chronic conditions.  Patient is here to establish care.  He is a new patient.  He is from [place] and is currently at [employer] for [employer].  He is back home for the summer.  He was previously seeing his pediatrician however they encouraged him to establish care with a new PCP.  He is overall doing well today without any acute concerns.  He has history of ADHD.  He has been diagnosed for several years and has been on Adderall XR 15 mg daily for the last 5 years or so.  He has been tolerating this very well.  No palpitations.  No headache.  No vision changes.  No mood changes.  Appetite has been okay.  Medication does help with this ability stay focused and on task.  Was also recently diagnosed with ulcerative colitis with GI.  Has been following with them routinely.  He had a flareup a few months ago but otherwise symptoms have been relatively stable.  He is currently on mesalamine suppository daily and Lialda by mouth daily.  Follows with dermatology for  acne.  Currently on ampicillin daily as well as topical Clindagel..  ROS: Per HPI, otherwise a complete review of systems was negative.   PMH:  The following were reviewed and entered/updated in epic: Past Medical History:  Diagnosis Date   Attention deficit disorder (ADD) 2012   Depression    Ulcerative colitis (HCC)    Patient Active Problem List   Diagnosis Date Noted   Ulcerative colitis (HCC) 02/28/2023   Allergic rhinitis 02/28/2023   Acne 02/28/2023   Attention deficit disorder (ADD) 2012   Past Surgical History:  Procedure Laterality Date   NO PAST SURGERIES      Family History  Problem Relation Age of Onset   Colon polyps Mother    Irritable bowel syndrome Mother    Hyperlipidemia Father    Hypertension Father    Diabetes Maternal Uncle    Diabetes Maternal Grandmother    Hypertension Maternal Grandmother    Colon polyps Maternal Grandfather    Heart disease Paternal Grandmother    Hyperlipidemia Paternal Grandmother    Bladder Cancer Paternal Grandfather    Prostate cancer Paternal Grandfather    Ulcerative colitis Cousin        meternal fisrt cousin   Colon cancer Maternal Great-grandmother    Diabetes Maternal Great-grandmother    Esophageal cancer Neg Hx    Stomach cancer Neg Hx    Rectal cancer Neg Hx  Medications- reviewed and updated Current Outpatient Medications  Medication Sig Dispense Refill   amphetamine-dextroamphetamine (ADDERALL XR) 15 MG 24 hr capsule Take 1 capsule by mouth in the morning. 30 capsule 0   ampicillin (PRINCIPEN) 500 MG capsule Take 1 capsule by mouth twice a day 60 capsule 6   ascorbic acid (VITAMIN C) 500 MG tablet Take 500 mg by mouth daily.     clindamycin (CLINDAGEL) 1 % gel Apply 1 Application topically 2 (two) times daily.     fluticasone (FLONASE ALLERGY RELIEF) 50 MCG/ACT nasal spray Place 1 Squirt into the nose daily.     mesalamine (CANASA) 1000 MG suppository Place 1 suppository (1,000 mg total) rectally at  bedtime. 30 suppository 5   mesalamine (LIALDA) 1.2 g EC tablet Take 4 tablets (4.8 g total) by mouth daily with breakfast. 120 tablet 3   No current facility-administered medications for this visit.    Allergies-reviewed and updated No Known Allergies  Social History   Socioeconomic History   Marital status: Single    Spouse name: Not on file   Number of children: 0   Years of education: Not on file   Highest education level: Not on file  Occupational History   Occupation: student  Tobacco Use   Smoking status: Never   Smokeless tobacco: Never  Vaping Use   Vaping Use: Former   Quit date: 12/29/2022  Substance and Sexual Activity   Alcohol use: Yes    Comment: occassionally a beer   Drug use: Not Currently    Types: Marijuana   Sexual activity: Not Currently  Other Topics Concern   Not on file  Social History Narrative   Not on file   Social Determinants of Health   Financial Resource Strain: Not on file  Food Insecurity: Not on file  Transportation Needs: Not on file  Physical Activity: Not on file  Stress: Not on file  Social Connections: Not on file          Objective:  Physical Exam: BP 120/80 (BP Location: Left Arm, Patient Position: Sitting, Cuff Size: Normal)   Pulse 79   Temp 98 F (36.7 C) (Temporal)   Ht 6' (1.829 m)   Wt 186 lb 6.1 oz (84.5 kg)   SpO2 97%   BMI 25.28 kg/m   Gen: No acute distress, resting comfortably CV: Regular rate and rhythm with no murmurs appreciated Pulm: Normal work of breathing, clear to auscultation bilaterally with no crackles, wheezes, or rhonchi Neuro: Grossly normal, moves all extremities Psych: Normal affect and thought content      Paul [name] M. Jimmey Ralph, MD 02/28/2023 10:01 AM

## 2023-02-28 NOTE — Assessment & Plan Note (Signed)
Follows with dermatology.  On ampicillin by mouth twice daily and clindamycin gel topically twice daily.

## 2023-02-28 NOTE — Assessment & Plan Note (Signed)
On Flonase daily.

## 2023-02-28 NOTE — Assessment & Plan Note (Signed)
Follows with GI.  Currently on mesalamine suppository and mesalamine by mouth daily.

## 2023-03-17 ENCOUNTER — Other Ambulatory Visit: Payer: Self-pay | Admitting: Family Medicine

## 2023-03-17 ENCOUNTER — Other Ambulatory Visit: Payer: Self-pay

## 2023-03-17 ENCOUNTER — Other Ambulatory Visit (HOSPITAL_BASED_OUTPATIENT_CLINIC_OR_DEPARTMENT_OTHER): Payer: Self-pay

## 2023-03-17 NOTE — Telephone Encounter (Signed)
Prescription Request  03/17/2023  LOV: 02/28/2023  What is the name of the medication or equipment?  amphetamine-dextroamphetamine (ADDERALL XR) 15 MG 24 hr capsule   Have you contacted your pharmacy to request a refill? No   Which pharmacy would you like this sent to?   [facility] [address] [place] [address] Phone: [phone]  Fax: [phone]   Patient notified that their request is being sent to the clinical staff for review and that they should receive a response within 2 business days.   Please advise at Mobile [phone] (mobile)

## 2023-03-21 ENCOUNTER — Other Ambulatory Visit (HOSPITAL_BASED_OUTPATIENT_CLINIC_OR_DEPARTMENT_OTHER): Payer: Self-pay

## 2023-03-22 ENCOUNTER — Other Ambulatory Visit (HOSPITAL_BASED_OUTPATIENT_CLINIC_OR_DEPARTMENT_OTHER): Payer: Self-pay

## 2023-03-22 MED ORDER — AMPHETAMINE-DEXTROAMPHET ER 15 MG PO CP24
15.0000 mg | ORAL_CAPSULE | Freq: Every morning | ORAL | 0 refills | Status: DC
  Filled 2023-03-22: qty 30, 30d supply, fill #0

## 2023-03-31 ENCOUNTER — Ambulatory Visit: Payer: BC Managed Care – PPO | Admitting: Gastroenterology

## 2023-03-31 ENCOUNTER — Encounter: Payer: Self-pay | Admitting: Gastroenterology

## 2023-03-31 VITALS — BP 110/82 | HR 84 | Ht 72.0 in | Wt 178.0 lb

## 2023-03-31 DIAGNOSIS — K5130 Ulcerative (chronic) rectosigmoiditis without complications: Secondary | ICD-10-CM | POA: Diagnosis not present

## 2023-03-31 NOTE — Progress Notes (Signed)
HPI :  22 year old male here for follow-up visit for ulcerative proctitis.  Recall he was diagnosed with this in August 2023 and colonoscopy.  He has been managed with Lialda and Canasa since diagnosis.  Follow-up flex sig in January 2024 showed mucosal healing with normal biopsies of his rectum.  He has been on Lialda 4 tabs daily (took higher dosing initially to control symptoms) and Canasa suppository.  He had a flare of symptoms back in May that bother him.  He developed some fecal urgency and some bleeding symptoms.  He was given a course of prednisone for a few weeks which resolved his symptoms entirely.  He states he was not taking Canasa daily at the time and he and father attribute his flare to that potentially.  He denied any NSAID use on a routine basis.  Since completing the steroid taper he has had no recurrence of any symptoms.  He had a fecal calprotectin back in May shortly after treatment of his flare which showed a normal value of 49, however he was on steroids when this was submitted.  He is currently feeling at baseline.  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  He is able to function fully, no urgency, no blood in stools.  No abdominal pains.  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  He presents with his father today with questions about long-term management of this.  He is tolerating mesalamine well.  Renal function was normal when last checked in May.  Past Medical History:  Diagnosis Date   Attention deficit disorder (ADD) 2012   Depression    Ulcerative colitis (HCC)      Past Surgical History:  Procedure Laterality Date   NO PAST SURGERIES     Family History  Problem Relation Age of Onset   Colon polyps Mother    Irritable bowel syndrome Mother    Hyperlipidemia Father    Hypertension Father    Diabetes Maternal Uncle    Diabetes Maternal Grandmother    Hypertension Maternal Grandmother    Colon polyps Maternal  Grandfather    Heart disease Paternal Grandmother    Hyperlipidemia Paternal Grandmother    Bladder Cancer Paternal Grandfather    Prostate cancer Paternal Grandfather    Ulcerative colitis Cousin        meternal fisrt cousin   Colon cancer Maternal Great-grandmother    Diabetes Maternal Great-grandmother    Esophageal cancer Neg Hx    Stomach cancer Neg Hx    Rectal cancer Neg Hx    Social History   Tobacco Use   Smoking status: Never   Smokeless tobacco: Never  Vaping Use   Vaping status: Former   Quit date: 12/29/2022  Substance Use Topics   Alcohol use: Yes    Comment: occassionally a beer   Drug use: Yes    Types: Marijuana   Current Outpatient Medications  Medication Sig Dispense Refill   amphetamine-dextroamphetamine (ADDERALL XR) 15 MG 24 hr capsule Take 1 capsule by mouth in the morning. 30 capsule 0   amphetamine-dextroamphetamine (ADDERALL XR) 15 MG 24 hr capsule Take 1 capsule by mouth every morning. 30 capsule 0   ampicillin (PRINCIPEN) 500 MG capsule Take 1 capsule by mouth twice a day 60 capsule 6   clindamycin (CLINDAGEL) 1 % gel Apply 1 Application topically 2 (two) times daily.     fluticasone (FLONASE ALLERGY RELIEF) 50 MCG/ACT nasal spray Place 1  Squirt into the nose daily.     mesalamine (CANASA) 1000 MG suppository Place 1 suppository (1,000 mg total) rectally at bedtime. 30 suppository 5   mesalamine (LIALDA) 1.2 g EC tablet Take 4 tablets (4.8 g total) by mouth daily with breakfast. 120 tablet 3   No current facility-administered medications for this visit.   No Known Allergies   Review of Systems: All systems reviewed and negative except where noted in HPI.   Lab Results  Component Value Date   WBC 6.2 01/07/2023   HGB 15.2 01/07/2023   HCT 43.6 01/07/2023   MCV 84.5 01/07/2023   PLT 221.0 01/07/2023    Lab Results  Component Value Date   NA 144 01/07/2023   CL 107 01/07/2023   K 3.9 01/07/2023   CO2 27 01/07/2023   BUN 19 01/07/2023    CREATININE 1.06 01/07/2023   GFR 101.53 01/07/2023   CALCIUM 9.6 01/07/2023   ALBUMIN 4.7 04/22/2022   GLUCOSE 80 01/07/2023    Lab Results  Component Value Date   ALT 16 04/22/2022   AST 17 04/22/2022   ALKPHOS 90 04/22/2022   BILITOT 1.6 (H) 04/22/2022     Physical Exam: BP 110/82   Pulse 84   Ht 6' (1.829 m)   Wt 178 lb (80.7 kg)   BMI 24.14 kg/m  Constitutional: Pleasant,well-developed, male in no acute distress. Neurological: Alert and oriented to person place and time. Psychiatric: Normal mood and affect. Behavior is normal.   ASSESSMENT: 22 y.o. male here for assessment of the following  1. Ulcerative rectosigmoiditis without complication (HCC)    Clinically doing really well now on Canasa daily and Lialda.  He had a flare in May that could have been due to compliance issues with Canasa.  Since being on a daily and taking Lialda he has had no recurrent symptoms and feeling really well.  We discussed options for therapy.  Denese Killings is likely the the best route of mesalamine administration to control this given much higher concentration of mesalamine applied to this area as opposed to Lialda.  If he does well over time without further flares I think it would be good to see if we can taper him off of Lialda and see if this can be controlled with Canasa monotherapy.  He states he does not have a problem using the suppositories and understands this may be more effective than oral route.  We otherwise discussed that if this does not work to control him he may need to escalate to biologic therapy.  Hopefully that is not needed.  We could consider adding curcumin to his regimen should he have additional flares prior to escalation of therapy.  While he is at school he will contact me if he needs steroids or has any problems.  He will try to use Canasa every day.  We will do another fecal calprotectin in a few months to see where this is trending.  If it remains normal and he feels well  we will try to de-escalate labs and potentially stop it over the upcoming months, while continuing Canasa.  He agrees with the plan.  I like to see him again in 6 months for follow-up.  Once stable on a regimen over time he can see me yearly.   PLAN: - continue Canasa daily (? If flare came from inconsistent use) - continue Lialda - fecal calprotectin - either do it at our lab or at school - in 1-2 months. If normal, then  de-escalate and reduce oral mesalamine. Will try over time to manage with Canasa monotherapy - call if any flares while at school - can add curcumin if needed if we need to escalate treatment, hopefully can avoid biologic therapy  Harlin Rain, MD [facility]

## 2023-03-31 NOTE — Patient Instructions (Signed)
We are giving you a printed order for a fecal protectin today.  Continue Canasa.  Continue Lialda.  Thank you for entrusting me with your care and for choosing [facility], Dr. Ileene Tip    If your blood pressure at your visit was 140/90 or greater, please contact your primary care physician to follow up on this. ______________________________________________________  If you are age 20 or older, your body mass index should be between 23-30. Your Body mass index is 24.14 kg/m. If this is out of the aforementioned range listed, please consider follow up with your Primary Care Provider.  If you are age 64 or younger, your body mass index should be between 19-25. Your Body mass index is 24.14 kg/m. If this is out of the aformentioned range listed, please consider follow up with your Primary Care Provider.  ________________________________________________________  The [facility] providers would like to encourage you to use [facility] to communicate with providers for non-urgent requests or questions.  Due to long hold times on the telephone, sending your provider a message by [facility] may be a faster and more efficient way to get a response.  Please allow 48 business hours for a response.  Please remember that this is for non-urgent requests.  _______________________________________________________  Due to recent changes in healthcare laws, you may see the results of your imaging and laboratory studies on MyChart before your provider has had a chance to review them.  We understand that in some cases there may be results that are confusing or concerning to you. Not all laboratory results come back in the same time frame and the provider may be waiting for multiple results in order to interpret others.  Please give [place] 48 hours in order for your provider to thoroughly review all the results before contacting the office for clarification of your results.  Mi

## 2023-04-20 ENCOUNTER — Other Ambulatory Visit: Payer: Self-pay | Admitting: Family Medicine

## 2023-04-20 NOTE — Telephone Encounter (Signed)
Pt requesting refill for Clindamycin 1% Gel, not been prescribed by you. Okay to fill?

## 2023-04-21 NOTE — Telephone Encounter (Signed)
Was this supposed to go to dermatology? We can refill I just wanted to make sure they sent it to the right place.

## 2023-05-30 ENCOUNTER — Other Ambulatory Visit (HOSPITAL_BASED_OUTPATIENT_CLINIC_OR_DEPARTMENT_OTHER): Payer: Self-pay

## 2023-06-08 ENCOUNTER — Other Ambulatory Visit (HOSPITAL_BASED_OUTPATIENT_CLINIC_OR_DEPARTMENT_OTHER): Payer: Self-pay

## 2023-06-09 ENCOUNTER — Other Ambulatory Visit (HOSPITAL_BASED_OUTPATIENT_CLINIC_OR_DEPARTMENT_OTHER): Payer: Self-pay

## 2023-06-09 MED ORDER — AMPHETAMINE-DEXTROAMPHET ER 15 MG PO CP24
15.0000 mg | ORAL_CAPSULE | Freq: Every morning | ORAL | 0 refills | Status: DC
  Filled 2023-06-09 – 2023-08-22 (×2): qty 30, 30d supply, fill #0

## 2023-06-21 ENCOUNTER — Other Ambulatory Visit (HOSPITAL_BASED_OUTPATIENT_CLINIC_OR_DEPARTMENT_OTHER): Payer: Self-pay

## 2023-07-08 ENCOUNTER — Other Ambulatory Visit: Payer: Self-pay | Admitting: Gastroenterology

## 2023-07-08 DIAGNOSIS — K921 Melena: Secondary | ICD-10-CM

## 2023-08-17 DIAGNOSIS — L700 Acne vulgaris: Secondary | ICD-10-CM | POA: Diagnosis not present

## 2023-08-18 IMAGING — DX DG SHOULDER 2+V*L*
3 series · 3 of 3 positions shown · non-contrast
Comparison: None.

CLINICAL DATA: Bilateral shoulder pain.

EXAM:
LEFT SHOULDER - 2+ VIEW

[shoulder grashey]
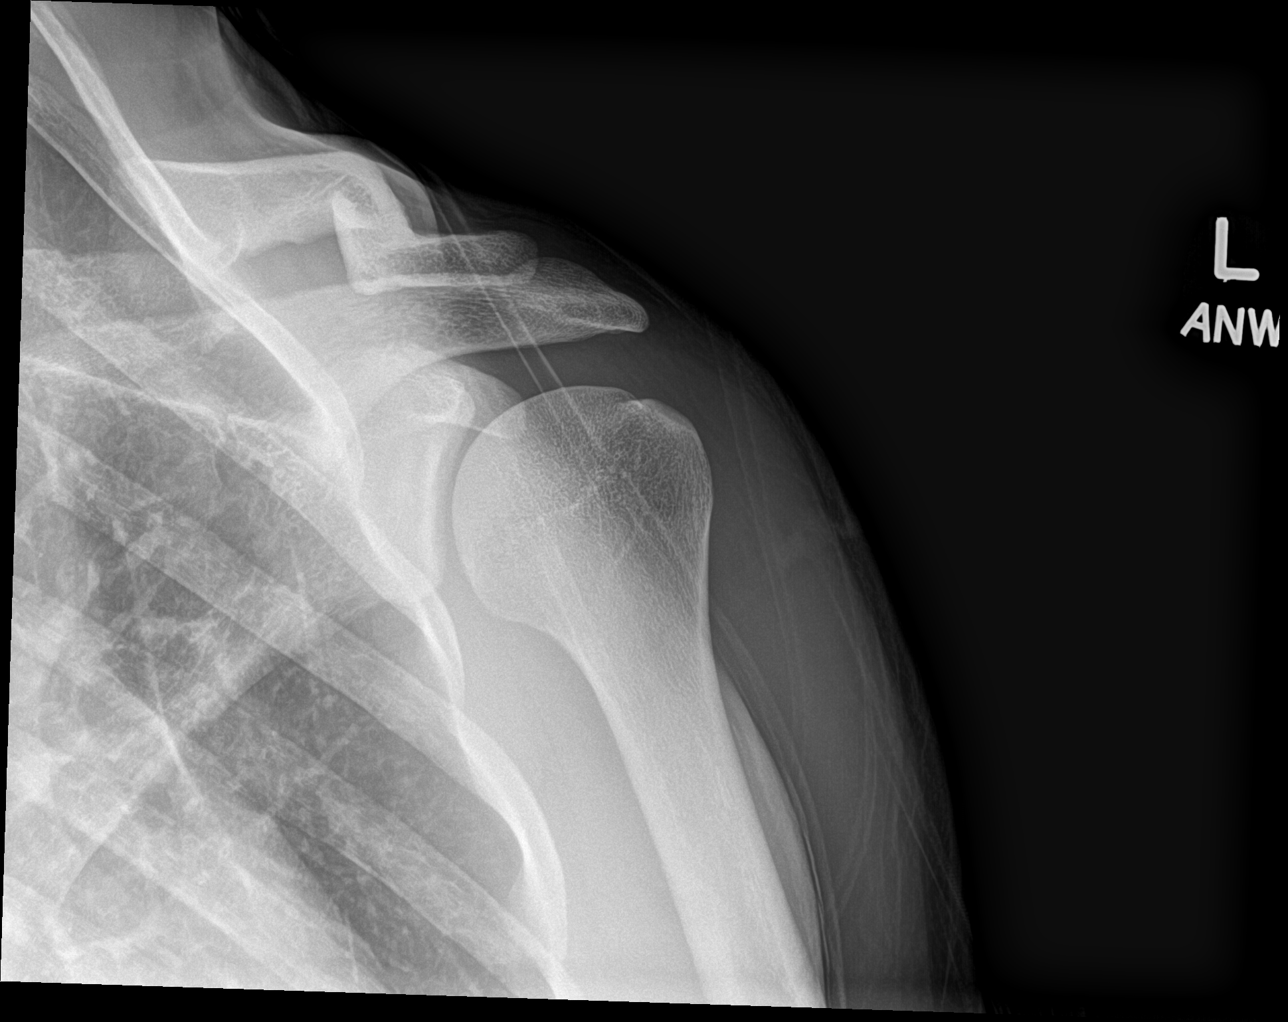

[shoulder y view]
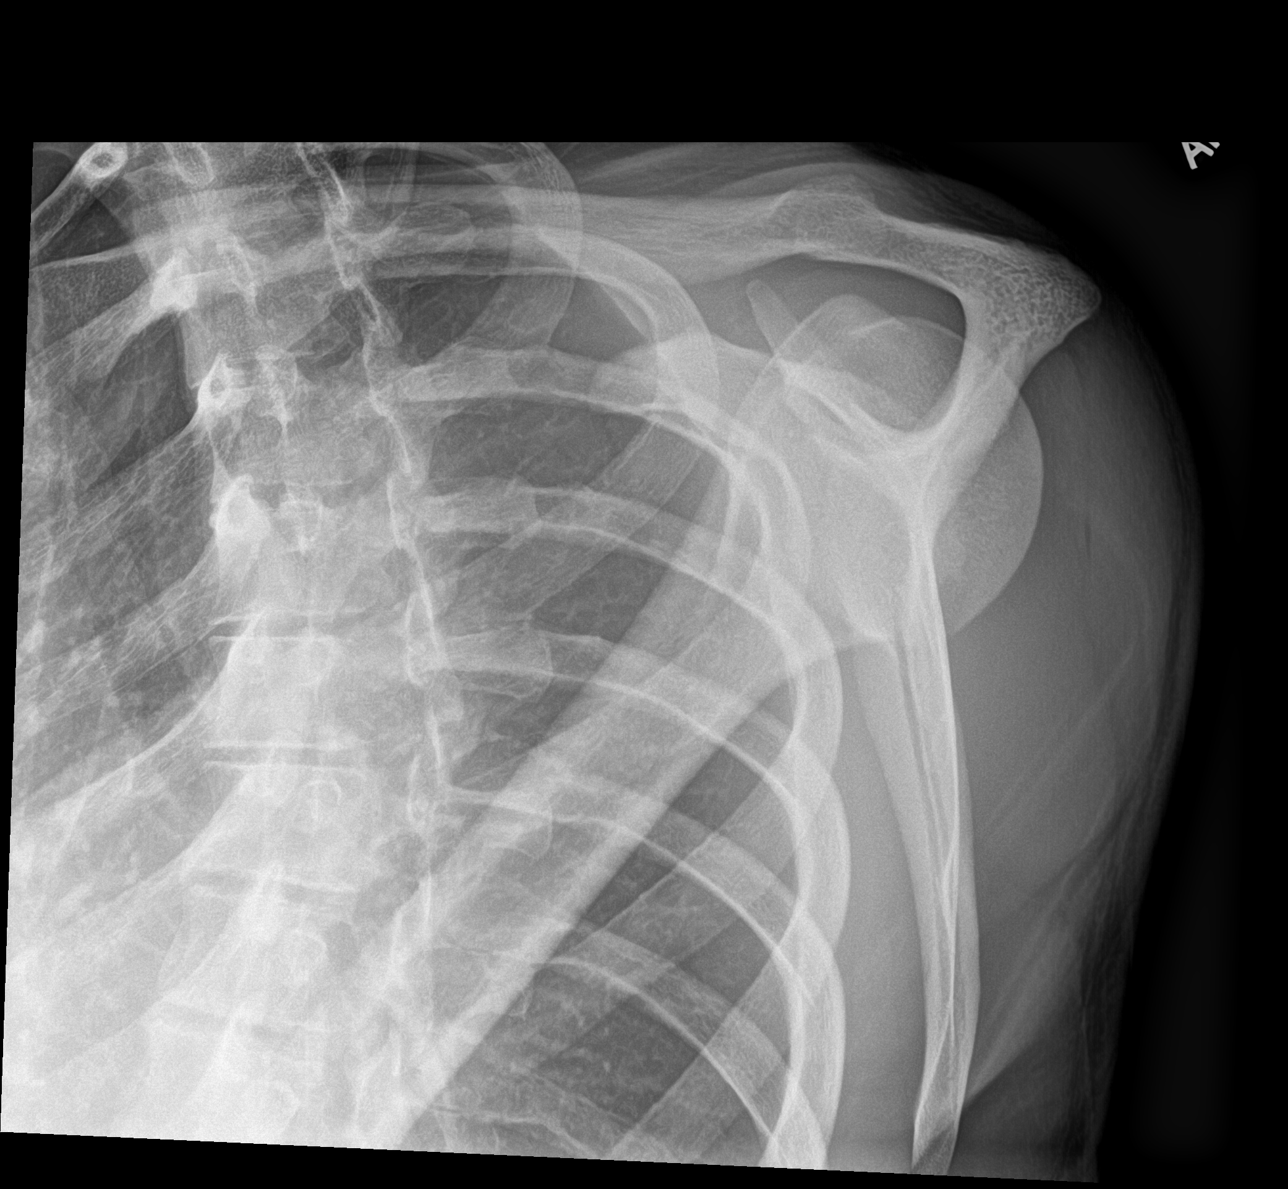

[shoulder axillary]
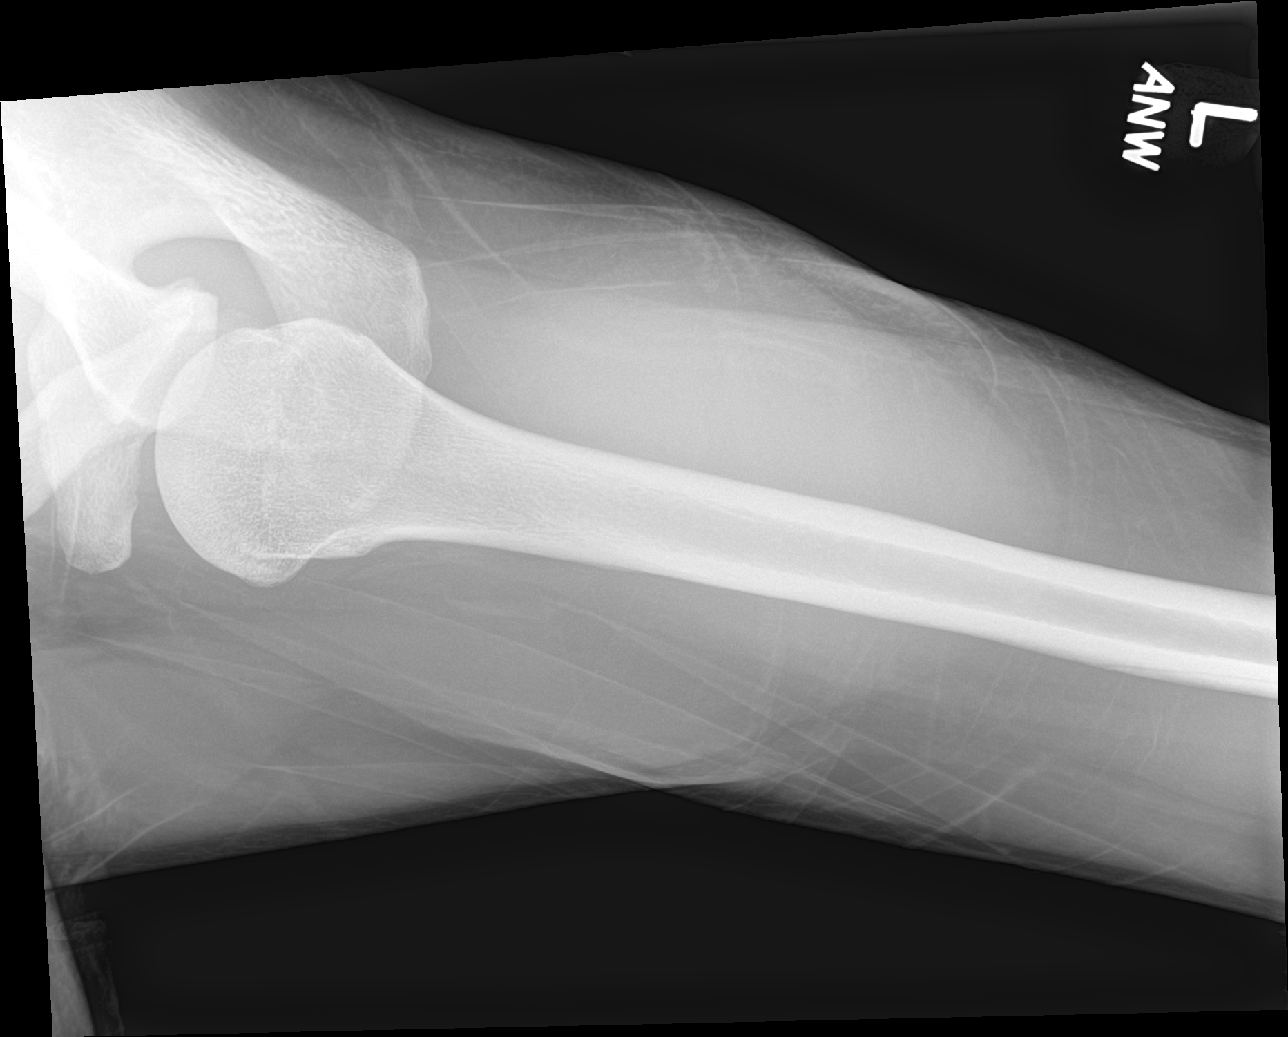

[3 of 3 positions shown; findings below may reference images not displayed]

FINDINGS: There is no evidence of fracture or dislocation. There is no
evidence of arthropathy or other focal bone abnormality. Soft
tissues are unremarkable. Angulation seen in the shaft of clavicle
in the AP view most likely is due to positioning. This finding could
not be seen in the rest of the images.
IMPRESSION: No radiographic abnormality is seen in the left shoulder.

## 2023-08-18 IMAGING — DX DG SHOULDER 2+V*R*
3 series · 3 of 3 positions shown · non-contrast
Comparison: None.

CLINICAL DATA: Bilateral shoulder pain.

EXAM:
RIGHT SHOULDER - 2+ VIEW

[shoulder grashey]
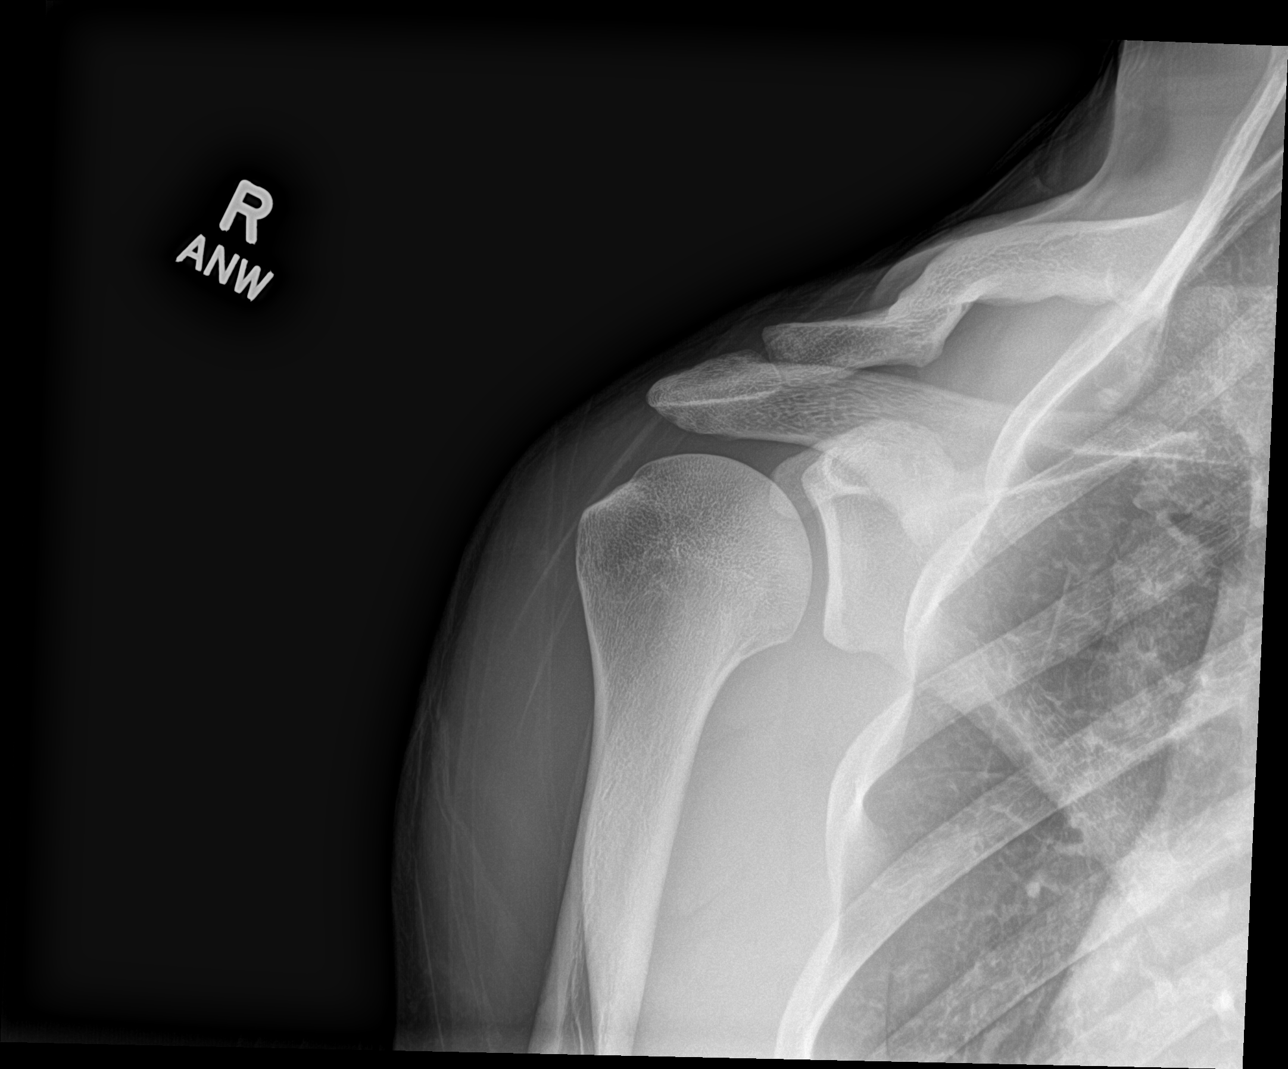

[shoulder y view]
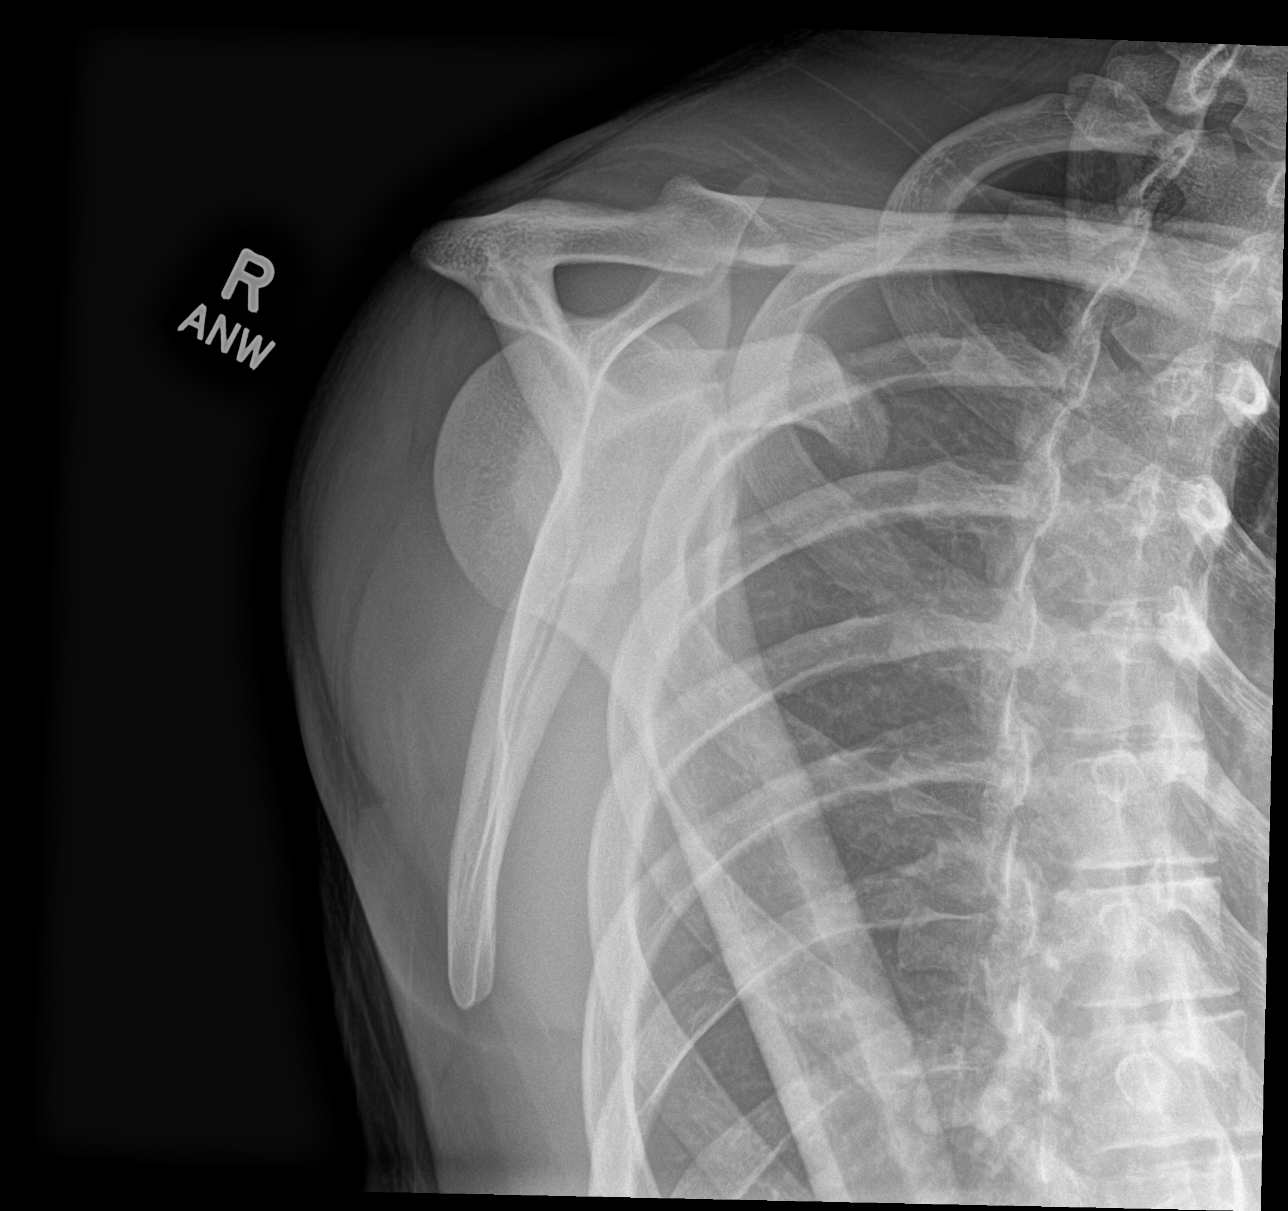

[shoulder axillary]
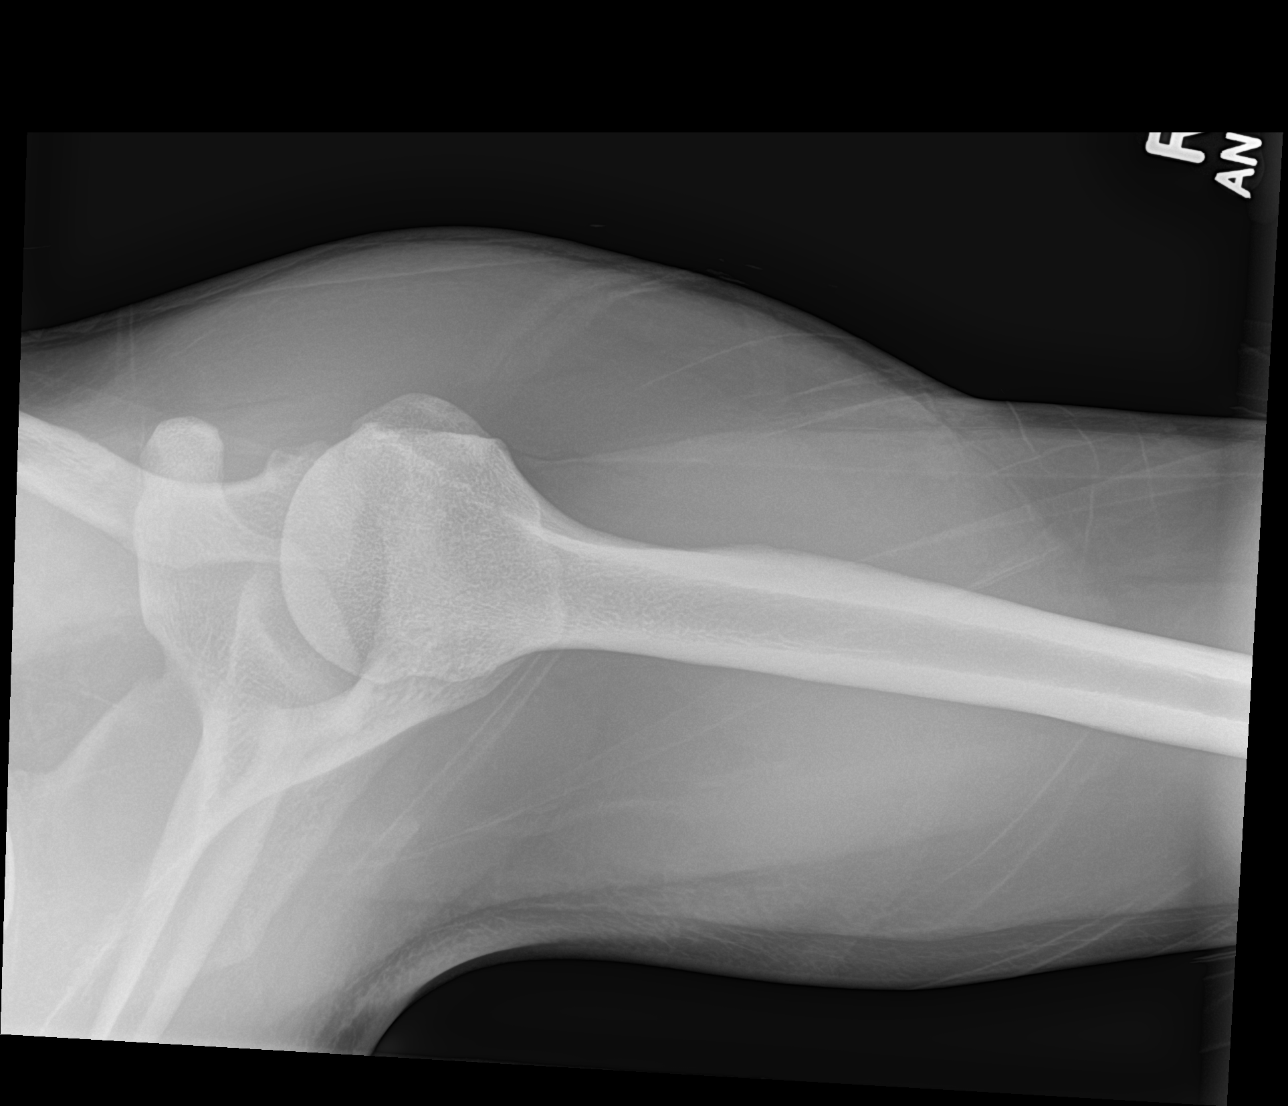

[3 of 3 positions shown; findings below may reference images not displayed]

FINDINGS: There is no evidence of fracture or dislocation. There is no
evidence of arthropathy or other focal bone abnormality. Soft
tissues are unremarkable.
IMPRESSION: No radiographic abnormality is seen in right shoulder.

## 2023-08-22 ENCOUNTER — Other Ambulatory Visit (HOSPITAL_BASED_OUTPATIENT_CLINIC_OR_DEPARTMENT_OTHER): Payer: Self-pay

## 2023-08-23 ENCOUNTER — Other Ambulatory Visit: Payer: Self-pay

## 2023-08-23 ENCOUNTER — Other Ambulatory Visit (HOSPITAL_BASED_OUTPATIENT_CLINIC_OR_DEPARTMENT_OTHER): Payer: Self-pay

## 2023-08-25 ENCOUNTER — Other Ambulatory Visit (HOSPITAL_BASED_OUTPATIENT_CLINIC_OR_DEPARTMENT_OTHER): Payer: Self-pay

## 2023-08-25 ENCOUNTER — Other Ambulatory Visit: Payer: Self-pay | Admitting: Gastroenterology

## 2023-08-25 DIAGNOSIS — K921 Melena: Secondary | ICD-10-CM

## 2023-08-25 MED ORDER — MESALAMINE 1000 MG RE SUPP
RECTAL | 2 refills | Status: DC
Start: 2023-08-25 — End: 2024-04-23
  Filled 2023-08-25: qty 30, 30d supply, fill #0
  Filled 2024-01-11: qty 30, 30d supply, fill #1
  Filled 2024-03-14 – 2024-03-15 (×2): qty 30, 30d supply, fill #2

## 2023-08-25 NOTE — Telephone Encounter (Signed)
Patient called [facility]

## 2023-08-26 ENCOUNTER — Other Ambulatory Visit (HOSPITAL_BASED_OUTPATIENT_CLINIC_OR_DEPARTMENT_OTHER): Payer: Self-pay

## 2023-09-06 ENCOUNTER — Telehealth: Payer: Self-pay | Admitting: Gastroenterology

## 2023-09-06 DIAGNOSIS — K51011 Ulcerative (chronic) pancolitis with rectal bleeding: Secondary | ICD-10-CM

## 2023-09-06 DIAGNOSIS — K5130 Ulcerative (chronic) rectosigmoiditis without complications: Secondary | ICD-10-CM

## 2023-09-06 NOTE — Telephone Encounter (Signed)
 Inbound call from patient, would like stool kit order sent to [facility], since he is now there for [employer].  Fax [phone].

## 2023-09-06 NOTE — Telephone Encounter (Signed)
 MyChart message to patient to get name, number and fax for lab at [facility] to verify can process order for fecal calprotectin

## 2023-09-07 NOTE — Telephone Encounter (Signed)
 Called [facility] at [phone].  They rec'd the order. Patient needs to call and schedule to have it done. MyChart message to patient

## 2023-09-14 ENCOUNTER — Other Ambulatory Visit (HOSPITAL_BASED_OUTPATIENT_CLINIC_OR_DEPARTMENT_OTHER): Payer: Self-pay

## 2023-09-20 DIAGNOSIS — K51011 Ulcerative (chronic) pancolitis with rectal bleeding: Secondary | ICD-10-CM | POA: Diagnosis not present

## 2023-09-20 DIAGNOSIS — K5130 Ulcerative (chronic) rectosigmoiditis without complications: Secondary | ICD-10-CM | POA: Diagnosis not present

## 2023-09-23 ENCOUNTER — Encounter: Payer: Self-pay | Admitting: Gastroenterology

## 2023-10-04 ENCOUNTER — Other Ambulatory Visit: Payer: Self-pay | Admitting: Gastroenterology

## 2023-10-04 DIAGNOSIS — K51011 Ulcerative (chronic) pancolitis with rectal bleeding: Secondary | ICD-10-CM

## 2023-10-04 DIAGNOSIS — M25572 Pain in left ankle and joints of left foot: Secondary | ICD-10-CM | POA: Diagnosis not present

## 2023-10-04 DIAGNOSIS — S93492A Sprain of other ligament of left ankle, initial encounter: Secondary | ICD-10-CM | POA: Diagnosis not present

## 2023-10-06 ENCOUNTER — Ambulatory Visit: Payer: BC Managed Care – PPO | Admitting: Family Medicine

## 2023-10-10 ENCOUNTER — Encounter: Payer: Self-pay | Admitting: Family Medicine

## 2023-10-17 ENCOUNTER — Other Ambulatory Visit: Payer: Self-pay | Admitting: Family Medicine

## 2023-10-21 DIAGNOSIS — S93492D Sprain of other ligament of left ankle, subsequent encounter: Secondary | ICD-10-CM | POA: Diagnosis not present

## 2023-10-25 DIAGNOSIS — J101 Influenza due to other identified influenza virus with other respiratory manifestations: Secondary | ICD-10-CM | POA: Diagnosis not present

## 2023-10-25 DIAGNOSIS — R059 Cough, unspecified: Secondary | ICD-10-CM | POA: Diagnosis not present

## 2023-10-27 DIAGNOSIS — M25472 Effusion, left ankle: Secondary | ICD-10-CM | POA: Diagnosis not present

## 2023-10-27 DIAGNOSIS — S93492A Sprain of other ligament of left ankle, initial encounter: Secondary | ICD-10-CM | POA: Diagnosis not present

## 2023-10-27 DIAGNOSIS — M25572 Pain in left ankle and joints of left foot: Secondary | ICD-10-CM | POA: Diagnosis not present

## 2023-10-28 ENCOUNTER — Ambulatory Visit: Payer: BC Managed Care – PPO | Admitting: Family Medicine

## 2023-10-31 ENCOUNTER — Telehealth: Payer: BC Managed Care – PPO | Admitting: Family Medicine

## 2023-11-01 DIAGNOSIS — M25572 Pain in left ankle and joints of left foot: Secondary | ICD-10-CM | POA: Diagnosis not present

## 2023-11-01 DIAGNOSIS — S93492A Sprain of other ligament of left ankle, initial encounter: Secondary | ICD-10-CM | POA: Diagnosis not present

## 2023-11-01 DIAGNOSIS — M25472 Effusion, left ankle: Secondary | ICD-10-CM | POA: Diagnosis not present

## 2023-11-03 DIAGNOSIS — M25472 Effusion, left ankle: Secondary | ICD-10-CM | POA: Diagnosis not present

## 2023-11-03 DIAGNOSIS — S93492A Sprain of other ligament of left ankle, initial encounter: Secondary | ICD-10-CM | POA: Diagnosis not present

## 2023-11-03 DIAGNOSIS — M25572 Pain in left ankle and joints of left foot: Secondary | ICD-10-CM | POA: Diagnosis not present

## 2023-11-04 ENCOUNTER — Telehealth: Admitting: Family Medicine

## 2023-11-04 ENCOUNTER — Encounter: Payer: Self-pay | Admitting: Family Medicine

## 2023-11-04 VITALS — Ht 72.0 in | Wt 185.0 lb

## 2023-11-04 DIAGNOSIS — K51919 Ulcerative colitis, unspecified with unspecified complications: Secondary | ICD-10-CM

## 2023-11-04 DIAGNOSIS — F988 Other specified behavioral and emotional disorders with onset usually occurring in childhood and adolescence: Secondary | ICD-10-CM

## 2023-11-04 MED ORDER — AMPHETAMINE-DEXTROAMPHET ER 15 MG PO CP24: 15.0000 mg | ORAL_CAPSULE | ORAL | 0 refills | Status: DC

## 2023-11-04 MED ORDER — AMPHETAMINE-DEXTROAMPHET ER 15 MG PO CP24: 15.0000 mg | ORAL_CAPSULE | Freq: Every morning | ORAL | 0 refills | Status: DC

## 2023-11-04 NOTE — Assessment & Plan Note (Signed)
 Stable.  No recent flares.  On mesalamine suppository and mesalamine by mouth per GI.

## 2023-11-04 NOTE — Progress Notes (Signed)
   Melesio Madara is a 23 y.o. male who presents today for a virtual office visit.  Assessment/Plan:  Chronic Problems Addressed Today: Attention deficit disorder (ADD) Database without red flags.  He is stable on Adderall XR 15 mg daily.  Needs a refill today.  He has been tolerating well without side effects.  Medications help with his ability stay focused and on task.  We will send a prescription in today.  He already has a controlled substance agreement in the chart.  He will come back for in person office visit in 3 to 6 months.  Ulcerative colitis (HCC) Stable.  No recent flares.  On mesalamine suppository and mesalamine by mouth per GI.     Subjective:  HPI:  See Assessment / plan for status of chronic conditions.  Patient here today for follow-up on ADHD.  We discussed this at his initial office visit here several months ago.  Previously been getting this from a psychiatrist but would like for [place] to take over management for prescription.  He has been on Adderall XR 50 mg daily for several years.  He is doing well this.  No significant side effects.        Objective/Observations  Physical Exam: Gen: NAD, resting comfortably Pulm: Normal work of breathing Neuro: Grossly normal, moves all extremities Psych: Normal affect and thought content  Virtual Visit via Video   I connected with Arman Filter on 11/04/23 at 10:40 AM EST by a video enabled telemedicine application and verified that I am speaking with the correct person using two identifiers. The limitations of evaluation and management by telemedicine and the availability of in person appointments were discussed. The patient expressed understanding and agreed to proceed.   Patient location: Home Provider location: [facility] [employer] [facility] Persons participating in the virtual visit: Myself and Patient     Katina Degree. Jimmey Ralph, MD 11/04/2023 11:10 AM

## 2023-11-04 NOTE — Assessment & Plan Note (Signed)
 Database without red flags.  He is stable on Adderall XR 15 mg daily.  Needs a refill today.  He has been tolerating well without side effects.  Medications help with his ability stay focused and on task.  We will send a prescription in today.  He already has a controlled substance agreement in the chart.  He will come back for in person office visit in 3 to 6 months.

## 2023-11-15 DIAGNOSIS — M25572 Pain in left ankle and joints of left foot: Secondary | ICD-10-CM | POA: Diagnosis not present

## 2023-11-15 DIAGNOSIS — M25472 Effusion, left ankle: Secondary | ICD-10-CM | POA: Diagnosis not present

## 2023-11-15 DIAGNOSIS — S93492A Sprain of other ligament of left ankle, initial encounter: Secondary | ICD-10-CM | POA: Diagnosis not present

## 2024-01-11 ENCOUNTER — Other Ambulatory Visit (HOSPITAL_BASED_OUTPATIENT_CLINIC_OR_DEPARTMENT_OTHER): Payer: Self-pay

## 2024-01-12 ENCOUNTER — Other Ambulatory Visit (HOSPITAL_BASED_OUTPATIENT_CLINIC_OR_DEPARTMENT_OTHER): Payer: Self-pay

## 2024-01-12 MED ORDER — MESALAMINE 1000 MG RE SUPP
1000.0000 mg | Freq: Every evening | RECTAL | 2 refills | Status: DC
  Filled 2024-01-14 – 2024-02-21 (×2): qty 30, 30d supply, fill #0

## 2024-01-12 MED FILL — Mesalamine Tab Delayed Release 1.2 GM: ORAL | 30 days supply | Qty: 120 | Fill #0 | Status: AC

## 2024-01-14 ENCOUNTER — Other Ambulatory Visit (HOSPITAL_BASED_OUTPATIENT_CLINIC_OR_DEPARTMENT_OTHER): Payer: Self-pay

## 2024-01-14 ENCOUNTER — Encounter: Payer: Self-pay | Admitting: Family Medicine

## 2024-01-16 ENCOUNTER — Other Ambulatory Visit (HOSPITAL_BASED_OUTPATIENT_CLINIC_OR_DEPARTMENT_OTHER): Payer: Self-pay

## 2024-01-16 MED ORDER — AMPHETAMINE-DEXTROAMPHET ER 15 MG PO CP24: 15.0000 mg | ORAL_CAPSULE | Freq: Every morning | ORAL | 0 refills | Status: DC

## 2024-01-17 ENCOUNTER — Other Ambulatory Visit (HOSPITAL_BASED_OUTPATIENT_CLINIC_OR_DEPARTMENT_OTHER): Payer: Self-pay

## 2024-01-17 MED ORDER — AMPHETAMINE-DEXTROAMPHET ER 15 MG PO CP24
15.0000 mg | ORAL_CAPSULE | Freq: Every morning | ORAL | 0 refills | Status: DC
  Filled 2024-01-17 – 2024-01-19 (×5): qty 30, 30d supply, fill #0

## 2024-01-17 NOTE — Addendum Note (Signed)
 Addended by: Alverda Joe on: 01/17/2024 09:25 AM   Modules accepted: Orders

## 2024-01-18 ENCOUNTER — Other Ambulatory Visit (HOSPITAL_BASED_OUTPATIENT_CLINIC_OR_DEPARTMENT_OTHER): Payer: Self-pay

## 2024-01-19 ENCOUNTER — Other Ambulatory Visit (HOSPITAL_BASED_OUTPATIENT_CLINIC_OR_DEPARTMENT_OTHER): Payer: Self-pay

## 2024-02-21 ENCOUNTER — Other Ambulatory Visit (HOSPITAL_BASED_OUTPATIENT_CLINIC_OR_DEPARTMENT_OTHER): Payer: Self-pay

## 2024-02-21 ENCOUNTER — Other Ambulatory Visit: Payer: Self-pay | Admitting: Gastroenterology

## 2024-02-21 ENCOUNTER — Other Ambulatory Visit: Payer: Self-pay

## 2024-02-21 DIAGNOSIS — K51011 Ulcerative (chronic) pancolitis with rectal bleeding: Secondary | ICD-10-CM

## 2024-02-21 MED ORDER — MESALAMINE 1.2 G PO TBEC
4.8000 g | DELAYED_RELEASE_TABLET | Freq: Every day | ORAL | 0 refills | Status: DC
  Filled 2024-02-21: qty 120, 30d supply, fill #0

## 2024-03-14 ENCOUNTER — Other Ambulatory Visit (HOSPITAL_BASED_OUTPATIENT_CLINIC_OR_DEPARTMENT_OTHER): Payer: Self-pay

## 2024-03-14 ENCOUNTER — Other Ambulatory Visit: Payer: Self-pay | Admitting: Family Medicine

## 2024-03-14 MED ORDER — AMPHETAMINE-DEXTROAMPHET ER 15 MG PO CP24
15.0000 mg | ORAL_CAPSULE | Freq: Every morning | ORAL | 0 refills | Status: DC
  Filled 2024-03-14: qty 30, 30d supply, fill #0

## 2024-04-02 ENCOUNTER — Other Ambulatory Visit: Payer: Self-pay | Admitting: Gastroenterology

## 2024-04-02 ENCOUNTER — Other Ambulatory Visit (HOSPITAL_BASED_OUTPATIENT_CLINIC_OR_DEPARTMENT_OTHER): Payer: Self-pay

## 2024-04-02 ENCOUNTER — Other Ambulatory Visit: Payer: Self-pay

## 2024-04-02 DIAGNOSIS — K51011 Ulcerative (chronic) pancolitis with rectal bleeding: Secondary | ICD-10-CM

## 2024-04-02 MED ORDER — MESALAMINE 1000 MG RE SUPP
1000.0000 mg | Freq: Every evening | RECTAL | 1 refills | Status: DC
  Filled 2024-04-02 – 2024-04-23 (×3): qty 30, 30d supply, fill #0

## 2024-04-02 MED ORDER — MESALAMINE 1.2 G PO TBEC
4.8000 g | DELAYED_RELEASE_TABLET | Freq: Every day | ORAL | 1 refills | Status: DC
  Filled 2024-04-02: qty 120, 30d supply, fill #0
  Filled 2024-04-16 – 2024-04-26 (×2): qty 120, 30d supply, fill #1

## 2024-04-12 ENCOUNTER — Other Ambulatory Visit (HOSPITAL_BASED_OUTPATIENT_CLINIC_OR_DEPARTMENT_OTHER): Payer: Self-pay

## 2024-04-16 ENCOUNTER — Other Ambulatory Visit: Payer: Self-pay | Admitting: Family Medicine

## 2024-04-16 ENCOUNTER — Other Ambulatory Visit (HOSPITAL_COMMUNITY): Payer: Self-pay

## 2024-04-16 ENCOUNTER — Other Ambulatory Visit: Payer: Self-pay

## 2024-04-16 ENCOUNTER — Other Ambulatory Visit (HOSPITAL_BASED_OUTPATIENT_CLINIC_OR_DEPARTMENT_OTHER): Payer: Self-pay

## 2024-04-16 MED ORDER — AMPHETAMINE-DEXTROAMPHET ER 15 MG PO CP24
15.0000 mg | ORAL_CAPSULE | Freq: Every morning | ORAL | 0 refills | Status: DC
  Filled 2024-04-16 – 2024-05-01 (×2): qty 30, 30d supply, fill #0

## 2024-04-17 ENCOUNTER — Other Ambulatory Visit (HOSPITAL_COMMUNITY): Payer: Self-pay

## 2024-04-17 ENCOUNTER — Encounter: Payer: Self-pay | Admitting: Pharmacist

## 2024-04-17 ENCOUNTER — Other Ambulatory Visit: Payer: Self-pay

## 2024-04-18 ENCOUNTER — Other Ambulatory Visit (HOSPITAL_COMMUNITY): Payer: Self-pay

## 2024-04-19 ENCOUNTER — Other Ambulatory Visit: Payer: Self-pay

## 2024-04-19 ENCOUNTER — Ambulatory Visit: Admitting: Nurse Practitioner

## 2024-04-23 ENCOUNTER — Other Ambulatory Visit: Payer: Self-pay | Admitting: Gastroenterology

## 2024-04-23 ENCOUNTER — Other Ambulatory Visit (HOSPITAL_COMMUNITY): Payer: Self-pay

## 2024-04-23 ENCOUNTER — Other Ambulatory Visit: Payer: Self-pay

## 2024-04-23 DIAGNOSIS — K921 Melena: Secondary | ICD-10-CM

## 2024-04-24 ENCOUNTER — Other Ambulatory Visit (HOSPITAL_COMMUNITY): Payer: Self-pay

## 2024-04-24 ENCOUNTER — Other Ambulatory Visit: Payer: Self-pay

## 2024-04-24 MED ORDER — MESALAMINE 1000 MG RE SUPP
1000.0000 mg | Freq: Every day | RECTAL | 0 refills | Status: DC
  Filled 2024-04-24: qty 30, 30d supply, fill #0

## 2024-05-01 ENCOUNTER — Other Ambulatory Visit (HOSPITAL_BASED_OUTPATIENT_CLINIC_OR_DEPARTMENT_OTHER): Payer: Self-pay

## 2024-05-01 ENCOUNTER — Other Ambulatory Visit (HOSPITAL_COMMUNITY): Payer: Self-pay

## 2024-05-01 ENCOUNTER — Other Ambulatory Visit: Payer: Self-pay

## 2024-05-02 ENCOUNTER — Other Ambulatory Visit: Payer: Self-pay

## 2024-05-02 ENCOUNTER — Other Ambulatory Visit (HOSPITAL_COMMUNITY): Payer: Self-pay

## 2024-05-06 ENCOUNTER — Other Ambulatory Visit (HOSPITAL_COMMUNITY): Payer: Self-pay

## 2024-05-17 ENCOUNTER — Other Ambulatory Visit: Payer: Self-pay | Admitting: Gastroenterology

## 2024-05-17 ENCOUNTER — Other Ambulatory Visit (HOSPITAL_BASED_OUTPATIENT_CLINIC_OR_DEPARTMENT_OTHER): Payer: Self-pay

## 2024-05-17 DIAGNOSIS — K921 Melena: Secondary | ICD-10-CM

## 2024-05-17 DIAGNOSIS — K51011 Ulcerative (chronic) pancolitis with rectal bleeding: Secondary | ICD-10-CM

## 2024-06-14 NOTE — Progress Notes (Signed)
 Ellouise Console, PA-C [address] [place], [place]  [address] Phone: [phone]   Primary Care Physician: Kennyth Worth HERO, MD  Primary Gastroenterologist:  Ellouise Console, PA-C / Elspeth Naval, MD   Chief Complaint: Follow-up ulcerative colitis (proctitis)   HPI:   Paul [name] is a 23 y.o. male, established patient Dr. 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  Last saw Dr. Naval 03/2023.  Originally diagnosed with ulcerative proctitis 03/2022 by colonoscopy.  Has been in clinical remission on Lialda  4.8 g and Canasa  suppositories as needed since then.  He is here today with his father.  Since  Discussed the use of AI scribe software for clinical note transcription with the patient, who gave verbal consent to proceed. History of Present Illness He was diagnosed with ulcerative proctosigmoiditis following a colonoscopy in August 2023, which revealed inflammation in the rectum and sigmoid colon. He was started on Lialda  (mesalamine ) and Canasa  suppositories. He is doing well overall, with occasional urgency that he associates with stress. He experiences minor loose stools if he misses his medication but notes that symptoms resolve once he resumes his regimen.  He takes Lialda  after breakfast and uses a Canasa  suppository at night, currently taking four Lialda  tablets daily. Missing the suppositories for about three days leads to the return of symptoms, but he has not experienced this since the summer. He attributes a past episode of rectal bleeding to the use of coarse toilet paper at school when he forgot his baby wipes.  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 He has adapted to his medication routine as part of his nightly regimen.  No recent diarrhea, constipation, or significant rectal bleeding aside from the incident with the toilet paper. No current issues with his  medications and has refills available.  12/2022 labs: normal fecal calprotectin 49.  Normal CBC with hemoglobin 15.2.  Normal BMP and CRP.  08/2022 flexible sigmoidoscopy: Rectum, rectosigmoid colon, and sigmoid colon appeared normal.  1 focal area of possible mild faint inflammation in the distal rectum.  Otherwise normal.  In clinical remission on Lialda  and Canasa .  03/2022 colonoscopy by Dr. Naval: Diffuse mild to moderate inflammation with erosions in the rectum up through the distal rectosigmoid junction.  Remainder of the colon and terminal ileum were normal.  Good prep.  Biopsies consistent with ulcerative proctitis.  Current Outpatient Medications  Medication Sig Dispense Refill   amphetamine -dextroamphetamine  (ADDERALL  XR) 15 MG 24 hr capsule Take 1 capsule by mouth every morning. 30 capsule 0   fluticasone (FLONASE ALLERGY RELIEF) 50 MCG/ACT nasal spray Place 1 Squirt into the nose daily.     mesalamine  (CANASA ) 1000 MG suppository Place 1 suppository (1,000 mg total) rectally at bedtime. 90 suppository 3   mesalamine  (LIALDA ) 1.2 g EC tablet Take 4 tablets (4.8 g total) by mouth daily with breakfast. 360 tablet 3   No current facility-administered medications for this visit.    Allergies as of 06/15/2024   (No Known Allergies)    Past Medical History:  Diagnosis Date   Attention deficit disorder (ADD) 2012   Depression    Ulcerative colitis (HCC)     Past Surgical History:  Procedure Laterality Date   NO PAST SURGERIES      Review of Systems:    All systems reviewed and negative except where noted in HPI.    Physical Exam:  BP 110/66   Pulse  68   Ht 6' (1.829 m)   Wt 203 lb 8 oz (92.3 kg)   BMI 27.60 kg/m  No LMP for male patient.  General: Well-nourished, well-developed in no acute distress.  Lungs: Clear to auscultation bilaterally. Non-labored. Heart: Regular rate and rhythm, no murmurs rubs or gallops.  Abdomen: Bowel sounds are normal; Abdomen is  Soft; No hepatosplenomegaly, masses or hernias;  No Abdominal Tenderness; No guarding or rebound tenderness. Neuro: Alert and oriented x 3.  Grossly intact.  Psych: Alert and cooperative, normal mood and affect.    Imaging Studies: No results found.  Labs: CBC    Component Value Date/Time   WBC 6.4 06/15/2024 1113   RBC 5.57 06/15/2024 1113   HGB 16.2 06/15/2024 1113   HCT 47.1 06/15/2024 1113   PLT 218.0 06/15/2024 1113   MCV 84.6 06/15/2024 1113   MCHC 34.4 06/15/2024 1113   RDW 12.3 06/15/2024 1113   LYMPHSABS 2.0 06/15/2024 1113   MONOABS 0.5 06/15/2024 1113   EOSABS 0.1 06/15/2024 1113   BASOSABS 0.0 06/15/2024 1113    CMP     Component Value Date/Time   NA 141 06/15/2024 1113   K 4.1 06/15/2024 1113   CL 104 06/15/2024 1113   CO2 29 06/15/2024 1113   GLUCOSE 76 06/15/2024 1113   BUN 14 06/15/2024 1113   CREATININE 0.95 06/15/2024 1113   CALCIUM 9.8 06/15/2024 1113   PROT 7.5 06/15/2024 1113   ALBUMIN 5.1 06/15/2024 1113   AST 21 06/15/2024 1113   ALT 31 06/15/2024 1113   ALKPHOS 72 06/15/2024 1113   BILITOT 1.6 (H) 06/15/2024 1113       Assessment and Plan:   Paul [name] is a 23 y.o. y/o male returns for follow-up of ulcerative proctosigmoiditis in clinical remission on Lialda  and Canasa  suppositories, originally diagnosed 03/2022.Occasional urgency and minor loose stools if medication missed. Current treatment with Lialda  and Canasa  effective. Normal fecal calprotectin levels indicate good control.  1.  Ulcerative proctosigmoiditis: In clinical remission -Labs CBC, CMP, CRP, fecal calprotectin. - Continue Lialda  4.8 g every morning, 90-day supply, 3 refills. - Continue Canasa  suppositories 1 nightly, #90, 3 refills.  Ellouise Console, PA-C  Follow up Follow up in one year or sooner if problems arise.

## 2024-06-15 ENCOUNTER — Ambulatory Visit: Admitting: Physician Assistant

## 2024-06-15 ENCOUNTER — Other Ambulatory Visit (INDEPENDENT_AMBULATORY_CARE_PROVIDER_SITE_OTHER)

## 2024-06-15 ENCOUNTER — Encounter: Payer: Self-pay | Admitting: Physician Assistant

## 2024-06-15 ENCOUNTER — Other Ambulatory Visit: Payer: Self-pay

## 2024-06-15 ENCOUNTER — Other Ambulatory Visit (HOSPITAL_BASED_OUTPATIENT_CLINIC_OR_DEPARTMENT_OTHER): Payer: Self-pay

## 2024-06-15 DIAGNOSIS — K5130 Ulcerative (chronic) rectosigmoiditis without complications: Secondary | ICD-10-CM

## 2024-06-15 DIAGNOSIS — K51011 Ulcerative (chronic) pancolitis with rectal bleeding: Secondary | ICD-10-CM

## 2024-06-15 DIAGNOSIS — K5100 Ulcerative (chronic) pancolitis without complications: Secondary | ICD-10-CM

## 2024-06-15 LAB — CBC WITH DIFFERENTIAL/PLATELET
Basophils Absolute: 0 K/uL (ref 0.0–0.1)
Basophils Relative: 0.3 % (ref 0.0–3.0)
Eosinophils Absolute: 0.1 K/uL (ref 0.0–0.7)
Eosinophils Relative: 2.3 % (ref 0.0–5.0)
HCT: 47.1 % (ref 39.0–52.0)
Hemoglobin: 16.2 g/dL (ref 13.0–17.0)
Lymphocytes Relative: 31.5 % (ref 12.0–46.0)
Lymphs Abs: 2 K/uL (ref 0.7–4.0)
MCHC: 34.4 g/dL (ref 30.0–36.0)
MCV: 84.6 fl (ref 78.0–100.0)
Monocytes Absolute: 0.5 K/uL (ref 0.1–1.0)
Monocytes Relative: 8.4 % (ref 3.0–12.0)
Neutro Abs: 3.7 K/uL (ref 1.4–7.7)
Neutrophils Relative %: 57.5 % (ref 43.0–77.0)
Platelets: 218 K/uL (ref 150.0–400.0)
RBC: 5.57 Mil/uL (ref 4.22–5.81)
RDW: 12.3 % (ref 11.5–15.5)
WBC: 6.4 K/uL (ref 4.0–10.5)

## 2024-06-15 LAB — C-REACTIVE PROTEIN: CRP: <0.5 mg/dL (ref 0.5–20.0)

## 2024-06-15 LAB — COMPREHENSIVE METABOLIC PANEL WITH GFR
ALT: 31 U/L (ref 0–53)
AST: 21 U/L (ref 0–37)
Albumin: 5.1 g/dL (ref 3.5–5.2)
Alkaline Phosphatase: 72 U/L (ref 39–117)
BUN: 14 mg/dL (ref 6–23)
CO2: 29 meq/L (ref 19–32)
Calcium: 9.8 mg/dL (ref 8.4–10.5)
Chloride: 104 meq/L (ref 96–112)
Creatinine, Ser: 0.95 mg/dL (ref 0.40–1.50)
GFR: 114.63 mL/min (ref >60.00)
Glucose, Bld: 76 mg/dL (ref 70–99)
Potassium: 4.1 meq/L (ref 3.5–5.1)
Sodium: 141 meq/L (ref 135–145)
Total Bilirubin: 1.6 mg/dL — ABNORMAL HIGH (ref 0.2–1.2)
Total Protein: 7.5 g/dL (ref 6.0–8.3)

## 2024-06-15 MED ORDER — FLUZONE 0.5 ML IM SUSY
0.5000 mL | PREFILLED_SYRINGE | Freq: Once | INTRAMUSCULAR | 0 refills | Status: AC
  Filled 2024-06-15: qty 0.5, 1d supply, fill #0

## 2024-06-15 MED ORDER — MESALAMINE 1.2 G PO TBEC
4.8000 g | DELAYED_RELEASE_TABLET | Freq: Every day | ORAL | 3 refills | Status: AC
  Filled 2024-06-15: qty 360, 90d supply, fill #0

## 2024-06-15 MED ORDER — MESALAMINE 1000 MG RE SUPP
1000.0000 mg | Freq: Every day | RECTAL | 3 refills | Status: AC
  Filled 2024-06-15: qty 90, 90d supply, fill #0

## 2024-06-15 NOTE — Patient Instructions (Addendum)
 Your provider has requested that you go to the basement level for lab work before leaving today. Press B on the elevator. The lab is located at the first door on the left as you exit the elevator.  We have sent the following medications to your pharmacy for you to pick up at your convenience: Mesalamine  Suppositories 1,000 mg rectally at bedtime and Lialda  1.2 g four tablets with breakfast  Please follow up sooner if symptoms increase or worsen  Due to recent changes in healthcare laws, you may see the results of your imaging and laboratory studies on MyChart before your provider has had a chance to review them.  We understand that in some cases there may be results that are confusing or concerning to you. Not all laboratory results come back in the same time frame and the provider may be waiting for multiple results in order to interpret others.  Please give us  48 hours in order for your provider to thoroughly review all the results before contacting the office for clarification of your results.   Thank you for trusting me with your gastrointestinal care!   Ellouise Console, PA-C _______________________________________________________  If your blood pressure at your visit was 140/90 or greater, please contact your primary care physician to follow up on this.  _______________________________________________________  If you are age 21 or older, your body mass index should be between 23-30. Your Body mass index is 27.6 kg/m. If this is out of the aforementioned range listed, please consider follow up with your Primary Care Provider.  If you are age 21 or younger, your body mass index should be between 19-25. Your Body mass index is 27.6 kg/m. If this is out of the aformentioned range listed, please consider follow up with your Primary Care Provider.   ________________________________________________________  The [facility] providers would like to encourage you to use MYCHART to communicate with  providers for non-urgent requests or questions.  Due to long hold times on the telephone, sending your provider a message by [facility] may be a faster and more efficient way to get a response.  Please allow 48 business hours for a response.  Please remember that this is for non-urgent requests.  _______________________________________________________

## 2024-06-15 NOTE — Progress Notes (Signed)
 Agree with assessment and plan as outlined.

## 2024-06-18 ENCOUNTER — Other Ambulatory Visit (HOSPITAL_BASED_OUTPATIENT_CLINIC_OR_DEPARTMENT_OTHER): Payer: Self-pay

## 2024-06-18 LAB — CALPROTECTIN, FECAL: Calprotectin, Fecal: 18 ug/g (ref 0–120)

## 2024-06-21 ENCOUNTER — Ambulatory Visit: Payer: Self-pay | Admitting: Physician Assistant

## 2024-07-06 ENCOUNTER — Other Ambulatory Visit (HOSPITAL_BASED_OUTPATIENT_CLINIC_OR_DEPARTMENT_OTHER): Payer: Self-pay

## 2024-07-06 MED ORDER — AMOXICILLIN-POT CLAVULANATE 875-125 MG PO TABS
1.0000 | ORAL_TABLET | Freq: Two times a day (BID) | ORAL | 0 refills | Status: AC
  Filled 2024-07-06: qty 14, 7d supply, fill #0

## 2024-08-07 ENCOUNTER — Other Ambulatory Visit (HOSPITAL_BASED_OUTPATIENT_CLINIC_OR_DEPARTMENT_OTHER): Payer: Self-pay

## 2024-08-07 MED ORDER — AZITHROMYCIN 250 MG PO TABS
ORAL_TABLET | ORAL | 0 refills | Status: AC
  Filled 2024-08-07: qty 6, 5d supply, fill #0

## 2024-08-20 ENCOUNTER — Other Ambulatory Visit (HOSPITAL_BASED_OUTPATIENT_CLINIC_OR_DEPARTMENT_OTHER): Payer: Self-pay

## 2024-08-20 ENCOUNTER — Other Ambulatory Visit: Payer: Self-pay | Admitting: Family Medicine

## 2024-08-21 ENCOUNTER — Other Ambulatory Visit (HOSPITAL_COMMUNITY): Payer: Self-pay

## 2024-08-21 MED ORDER — AMPHETAMINE-DEXTROAMPHET ER 15 MG PO CP24
15.0000 mg | ORAL_CAPSULE | Freq: Every morning | ORAL | 0 refills | Status: AC
  Filled 2024-08-21 – 2024-09-04 (×2): qty 30, 30d supply, fill #0

## 2024-09-02 ENCOUNTER — Other Ambulatory Visit (HOSPITAL_COMMUNITY): Payer: Self-pay

## 2024-09-04 ENCOUNTER — Other Ambulatory Visit (HOSPITAL_COMMUNITY): Payer: Self-pay
